# Patient Record
Sex: Female | Born: 1995 | Race: White | Hispanic: No | Marital: Married | Smoking: Never smoker
Health system: Southern US, Community
[De-identification: ages and names within clinical notes are randomized; demographics above are authoritative.]

## PROBLEM LIST (undated history)

## (undated) ENCOUNTER — Inpatient Hospital Stay (HOSPITAL_COMMUNITY): Payer: Self-pay

## (undated) DIAGNOSIS — F419 Anxiety disorder, unspecified: Secondary | ICD-10-CM

## (undated) DIAGNOSIS — H5500 Unspecified nystagmus: Secondary | ICD-10-CM

## (undated) DIAGNOSIS — E559 Vitamin D deficiency, unspecified: Secondary | ICD-10-CM

## (undated) DIAGNOSIS — J45909 Unspecified asthma, uncomplicated: Secondary | ICD-10-CM

## (undated) DIAGNOSIS — M069 Rheumatoid arthritis, unspecified: Secondary | ICD-10-CM

## (undated) DIAGNOSIS — L409 Psoriasis, unspecified: Secondary | ICD-10-CM

## (undated) DIAGNOSIS — F411 Generalized anxiety disorder: Secondary | ICD-10-CM

## (undated) HISTORY — DX: Unspecified nystagmus: H55.00

## (undated) HISTORY — DX: Vitamin D deficiency, unspecified: E55.9

## (undated) HISTORY — DX: Rheumatoid arthritis, unspecified: M06.9

## (undated) HISTORY — DX: Generalized anxiety disorder: F41.1

## (undated) HISTORY — PX: REVISION OF SCAR TISSUE RECTUS MUSCLE: SHX2351

## (undated) HISTORY — PX: WISDOM TOOTH EXTRACTION: SHX21

---

## 2017-06-07 ENCOUNTER — Encounter (HOSPITAL_COMMUNITY): Payer: Self-pay | Admitting: Emergency Medicine

## 2017-06-07 ENCOUNTER — Emergency Department (HOSPITAL_COMMUNITY)
Admission: EM | Admit: 2017-06-07 | Discharge: 2017-06-08 | Disposition: A | Discharge status: Home / Self Care | Payer: BLUE CROSS/BLUE SHIELD | Attending: Emergency Medicine | Admitting: Emergency Medicine

## 2017-06-07 ENCOUNTER — Other Ambulatory Visit: Payer: Self-pay

## 2017-06-07 DIAGNOSIS — N3001 Acute cystitis with hematuria: Secondary | ICD-10-CM | POA: Diagnosis not present

## 2017-06-07 DIAGNOSIS — R300 Dysuria: Secondary | ICD-10-CM | POA: Diagnosis present

## 2017-06-07 LAB — URINALYSIS, ROUTINE W REFLEX MICROSCOPIC
Bilirubin Urine: NEGATIVE
Glucose, UA: NEGATIVE mg/dL
Ketones, ur: NEGATIVE mg/dL
Nitrite: NEGATIVE
PH: 7 (ref 5.0–8.0)
Protein, ur: NEGATIVE mg/dL
SPECIFIC GRAVITY, URINE: 1.002 — AB (ref 1.005–1.030)

## 2017-06-07 LAB — POC URINE PREG, ED: Preg Test, Ur: NEGATIVE

## 2017-06-07 MED ORDER — PHENAZOPYRIDINE HCL 200 MG PO TABS: 200.0000 mg | ORAL_TABLET | Freq: Three times a day (TID) | ORAL | 0 refills | Status: DC

## 2017-06-07 MED ORDER — PHENAZOPYRIDINE HCL 200 MG PO TABS
200.0000 mg | ORAL_TABLET | Freq: Once | ORAL | Status: AC
  Administered 2017-06-07: 200 mg via ORAL
  Filled 2017-06-07: qty 1

## 2017-06-07 MED ORDER — NITROFURANTOIN MONOHYD MACRO 100 MG PO CAPS: 100.0000 mg | ORAL_CAPSULE | Freq: Two times a day (BID) | ORAL | 0 refills | Status: DC

## 2017-06-07 MED ORDER — NITROFURANTOIN MONOHYD MACRO 100 MG PO CAPS
100.0000 mg | ORAL_CAPSULE | Freq: Once | ORAL | Status: AC
  Administered 2017-06-07: 100 mg via ORAL
  Filled 2017-06-07: qty 1

## 2017-06-07 NOTE — Discharge Instructions (Signed)
We have sent your urine for culture. If we need to change your medication someone will call you.

## 2017-06-07 NOTE — ED Provider Notes (Signed)
[facility]-EMERGENCY DEPT Provider Note   CSN: [id number] Arrival date & time: 06/07/17  2106     History   Chief Complaint Chief Complaint  Patient presents with  . Dysuria    HPI Debra Payne is a 21 y.o. female presents to the ED with UTI symptoms that started just a few hours ago. Patient states that she has had UTI's in the past and knows that this is one. She denies vaginal d/c and has no concern for STI's.   HPI  History reviewed. No pertinent past medical history.  There are no active problems to display for this patient.   History reviewed. No pertinent surgical history.  OB History    No data available       Home Medications    Prior to Admission medications   Medication Sig Start Date End Date Taking? Authorizing Provider  nitrofurantoin, macrocrystal-monohydrate, (MACROBID) 100 MG capsule Take 1 capsule (100 mg total) by mouth 2 (two) times daily. 06/07/17   Janne NapoleonNeese, Namiah Dunnavant M, NP  phenazopyridine (PYRIDIUM) 200 MG tablet Take 1 tablet (200 mg total) by mouth 3 (three) times daily. 06/07/17   Janne NapoleonNeese, Laelani Vasko M, NP    Family History No family history on file.  Social History Social History   Tobacco Use  . Smoking status: Not on file  Substance Use Topics  . Alcohol use: Not on file  . Drug use: Not on file     Allergies   Amoxicillin   Review of Systems Review of Systems  Constitutional: Negative for fever.  HENT: Negative.   Gastrointestinal: Positive for abdominal pain. Negative for nausea and vomiting.  Genitourinary: Positive for dysuria, frequency and urgency. Negative for flank pain.  Musculoskeletal: Negative for back pain.  Skin: Negative for rash.  Neurological: Negative for headaches.  Psychiatric/Behavioral: Negative for confusion.     Physical Exam Updated Vital Signs BP (!) 132/98 (BP Location: Right Arm)   Pulse 72   Temp 98.4 F (36.9 C) (Oral)   Resp 18   LMP 06/06/2017   SpO2 100%    Physical Exam  Constitutional: She is oriented to person, place, and time. She appears well-developed and well-nourished. No distress.  HENT:  Head: Normocephalic and atraumatic.  Eyes: EOM are normal.  Neck: Neck supple.  Cardiovascular: Normal rate.  Pulmonary/Chest: Effort normal.  Abdominal: Soft. There is tenderness in the suprapubic area. There is no rebound, no guarding and no CVA tenderness.  Genitourinary:  Genitourinary Comments: Patient declined pelvic exam.  Musculoskeletal: Normal range of motion.  Neurological: She is alert and oriented to person, place, and time. No cranial nerve deficit.  Skin: Skin is warm and dry.  Psychiatric: She has a normal mood and affect.  Nursing note and vitals reviewed.    ED Treatments / Results  Labs (all labs ordered are listed, but only abnormal results are displayed) Labs Reviewed  URINALYSIS, ROUTINE W REFLEX MICROSCOPIC - Abnormal; Notable for the following components:      Result Value   Specific Gravity, Urine 1.002 (*)    Hgb urine dipstick LARGE (*)    Leukocytes, UA MODERATE (*)    Bacteria, UA RARE (*)    Squamous Epithelial / LPF 0-5 (*)    All other components within normal limits  URINE CULTURE  POC URINE PREG, ED    Radiology No results found.  Procedures Procedures (including critical care time)  Medications Ordered in ED Medications  nitrofurantoin (macrocrystal-monohydrate) (MACROBID) capsule 100 mg (  100 mg Oral Given 06/07/17 2308)  phenazopyridine (PYRIDIUM) tablet 200 mg (200 mg Oral Given 06/07/17 2308)     Initial Impression / Assessment and Plan / ED Course  I have reviewed the triage vital signs and the nursing notes. Pt has been diagnosed with a UTI. Pt is afebrile, no CVA tenderness, normotensive, and denies N/V. Pt to be dc home with antibiotics and instructions to follow up with PCP if symptoms persist.  Final Clinical Impressions(s) / ED Diagnoses   Final diagnoses:  Acute cystitis  with hematuria    ED Discharge Orders        Ordered    nitrofurantoin, macrocrystal-monohydrate, (MACROBID) 100 MG capsule  2 times daily     06/07/17 2256    phenazopyridine (PYRIDIUM) 200 MG tablet  3 times daily     06/07/17 2256       Kerrie Buffaloeese, Karanvir Balderston Colli[place], [place]NP 06/07/17 2312    Rolan BuccoBelfi, Melanie, MD 06/07/17 2334

## 2017-06-07 NOTE — ED Triage Notes (Signed)
Patient reports urinary frequency and pain with urination x a couple hours. Describes urine as cloudy. Hx UTI. Denies N/V/D.

## 2017-06-10 LAB — URINE CULTURE: Culture: >=100000 — AB

## 2017-06-11 ENCOUNTER — Telehealth: Payer: Self-pay | Admitting: *Deleted

## 2017-06-11 NOTE — Telephone Encounter (Signed)
Post ED Visit - Positive Culture Follow-up  Culture report reviewed by antimicrobial stewardship pharmacist:  []  Enzo BiNathan Batchelder, Pharm.D. []  Celedonio MiyamotoJeremy Frens, Pharm.D., BCPS AQ-ID []  Garvin FilaMike Maccia, Pharm.D., BCPS []  Georgina PillionElizabeth Martin, Pharm.D., BCPS []  RosevilleMinh Pham, 17[address] Rainbow BoulevardPharm.D., BCPS, AAHIVP []  Estella HuskMichelle Turner, Pharm.D., BCPS, AAHIVP []  Lysle Pearlachel Rumbarger, PharmD, BCPS []  Blake DivineShannon Parkey, PharmD []  Pollyann SamplesAndy Johnston, PharmD, BCPS Dimple NanasShannon Parker, PharmD  Positive urine culture Treated with Nitrofurantoin Monohyd Macro, organism sensitive to the same and no further patient follow-up is required at this time.  Virl AxeRobertson, Miriam Liles [place] 06/11/2017, 11:09 AM

## 2018-06-06 ENCOUNTER — Other Ambulatory Visit: Payer: Self-pay

## 2018-06-06 ENCOUNTER — Emergency Department (HOSPITAL_BASED_OUTPATIENT_CLINIC_OR_DEPARTMENT_OTHER)
Admission: EM | Admit: 2018-06-06 | Discharge: 2018-06-06 | Disposition: A | Discharge status: Home / Self Care | Payer: BLUE CROSS/BLUE SHIELD | Attending: Emergency Medicine | Admitting: Emergency Medicine

## 2018-06-06 ENCOUNTER — Emergency Department (HOSPITAL_BASED_OUTPATIENT_CLINIC_OR_DEPARTMENT_OTHER): Payer: BLUE CROSS/BLUE SHIELD

## 2018-06-06 ENCOUNTER — Encounter (HOSPITAL_BASED_OUTPATIENT_CLINIC_OR_DEPARTMENT_OTHER): Payer: Self-pay | Admitting: Emergency Medicine

## 2018-06-06 DIAGNOSIS — Z79899 Other long term (current) drug therapy: Secondary | ICD-10-CM | POA: Diagnosis not present

## 2018-06-06 DIAGNOSIS — K208 Other esophagitis without bleeding: Secondary | ICD-10-CM

## 2018-06-06 DIAGNOSIS — R079 Chest pain, unspecified: Secondary | ICD-10-CM | POA: Diagnosis present

## 2018-06-06 MED ORDER — ALUM & MAG HYDROXIDE-SIMETH 200-200-20 MG/5ML PO SUSP
15.0000 mL | Freq: Once | ORAL | Status: AC
  Administered 2018-06-06: 15 mL via ORAL
  Filled 2018-06-06: qty 30

## 2018-06-06 NOTE — ED Provider Notes (Signed)
[facility] EMERGENCY DEPARTMENT Provider Note   CSN: [id number] Arrival date & time: 06/06/18  1858     History   Chief Complaint Chief Complaint  Patient presents with  . Chest Pain    HPI Debra Payne is a 22 y.o. female.  22 yo F with a chief complaint of pain that starts the back of her throat and has down to her abdomen.  Described as a burning pain.  Going on for the past for 5 days.  Patient recently started on doxycycline for acne.  She denies fevers or chills denies abdominal pain has nausea but denies vomiting.  Denies chest pain.  Denies exertional symptoms.  The history is provided by the patient and the spouse.  Chest Pain   This is a new problem. The current episode started yesterday. The problem occurs constantly. The problem has not changed since onset.The pain is associated with eating. The pain is present in the substernal region. The pain is at a severity of 4/10. The patient is experiencing no pain. The pain radiates to the epigastrium. Duration of episode(s) is 5 days. Associated symptoms include nausea. Pertinent negatives include no abdominal pain, no dizziness, no fever, no headaches, no palpitations, no shortness of breath and no vomiting. She has tried nothing for the symptoms. The treatment provided no relief.    History reviewed. No pertinent past medical history.  There are no active problems to display for this patient.   History reviewed. No pertinent surgical history.   OB History   No obstetric history on file.      Home Medications    Prior to Admission medications   Medication Sig Start Date End Date Taking? Authorizing Provider  busPIRone (BUSPAR) 30 MG tablet Take 30 mg by mouth 2 (two) times daily.   Yes [provider]  doxycycline (ADOXA) 50 MG tablet Take 50 mg by mouth 2 (two) times daily.   Yes [provider]  levocetirizine (XYZAL) 5 MG tablet Take 5 mg by mouth every evening.   Yes  [provider]  methotrexate (RHEUMATREX) 10 MG tablet Take 10 mg by mouth once a week. Caution: Chemotherapy. Protect from light.   Yes [provider]  montelukast (SINGULAIR) 10 MG tablet Take 10 mg by mouth at bedtime.   Yes [provider]  norethindrone-ethinyl estradiol-iron (ESTROSTEP FE,TILIA FE,TRI-LEGEST FE) 1-20/1-30/1-35 MG-MCG tablet Take 1 tablet by mouth daily.   Yes [provider]  nitrofurantoin, macrocrystal-monohydrate, (MACROBID) 100 MG capsule Take 1 capsule (100 mg total) by mouth 2 (two) times daily. 06/07/17   Janne Napoleon, NP  phenazopyridine (PYRIDIUM) 200 MG tablet Take 1 tablet (200 mg total) by mouth 3 (three) times daily. 06/07/17   Janne Napoleon, NP    Family History History reviewed. No pertinent family history.  Social History Social History   Tobacco Use  . Smoking status: Never Smoker  . Smokeless tobacco: Never Used  Substance Use Topics  . Alcohol use: Never    Frequency: Never  . Drug use: Never     Allergies   Amoxicillin   Review of Systems Review of Systems  Constitutional: Negative for chills and fever.  HENT: Negative for congestion and rhinorrhea.   Eyes: Negative for redness and visual disturbance.  Respiratory: Negative for shortness of breath and wheezing.   Cardiovascular: Positive for chest pain. Negative for palpitations.  Gastrointestinal: Positive for nausea. Negative for abdominal pain and vomiting.  Genitourinary: Negative for dysuria and  urgency.  Musculoskeletal: Negative for arthralgias and myalgias.  Skin: Negative for pallor and wound.  Neurological: Negative for dizziness and headaches.     Physical Exam Updated Vital Signs BP 130/88   Pulse 90   Temp 98.3 F (36.8 C)   Resp 20   Ht 5\' 10"  (1.778 m)   Wt 72.6 kg   LMP 06/06/2018   SpO2 98%   BMI 22.96 kg/m   Physical Exam Vitals signs and nursing note reviewed.  Constitutional:      General: She is not in  acute distress.    Appearance: She is well-developed. She is not diaphoretic.  HENT:     Head: Normocephalic and atraumatic.  Eyes:     Pupils: Pupils are equal, round, and reactive to light.  Neck:     Musculoskeletal: Normal range of motion and neck supple.  Cardiovascular:     Rate and Rhythm: Normal rate and regular rhythm.     Heart sounds: No murmur. No friction rub. No gallop.   Pulmonary:     Effort: Pulmonary effort is normal.     Breath sounds: No wheezing or rales.  Abdominal:     General: There is no distension.     Palpations: Abdomen is soft.     Tenderness: There is no abdominal tenderness.  Musculoskeletal:        General: No tenderness.  Skin:    General: Skin is warm and dry.  Neurological:     Mental Status: She is alert and oriented to person, place, and time.  Psychiatric:        Behavior: Behavior normal.      ED Treatments / Results  Labs (all labs ordered are listed, but only abnormal results are displayed) Labs Reviewed - No data to display  EKG EKG Interpretation  Date/Time:  Sunday June 06 2018 19:27:31 EST Ventricular Rate:  77 PR Interval:  114 QRS Duration: 82 QT Interval:  394 QTC Calculation: 445 R Axis:   64 Text Interpretation:  Normal sinus rhythm Normal ECG No old tracing to compare Confirmed by Makeda Peeks ([id number]) on 06/06/2018 9:49:23 PM   Radiology Dg Chest 2 View  Result Date: 06/06/2018 CLINICAL DATA:  Burning chest pain (esophageal pain?) over the past 3-4 days unrelieved by over-the-counter treatment. Current history of asthma. EXAM: CHEST - 2 VIEW COMPARISON:  None. FINDINGS: Cardiomediastinal silhouette unremarkable. Lungs clear. Bronchovascular markings normal. Pulmonary vascularity normal. No pneumothorax. No pleural effusions. Visualized bony thorax intact. IMPRESSION: Normal examination. Electronically Signed   By: Thomas  Lawrence M.D.   On: 06/06/2018 20:05    Procedures Procedures (including critical care  time)  Medications Ordered in ED Medications  alum & mag hydroxide-simeth (MAALOX/MYLANTA) 200-200-20 MG/5ML suspension 15 mL (15 mLs Oral Given 06/06/18 2235)     Initial Impression / Assessment and Plan / ED Course  I have reviewed the triage vital signs and the nursing notes.  Pertinent labs & imaging results that were available during my care of the patient were reviewed by me and considered in my medical decision making (see chart for details).     22  yo F clinically with pill esophagitis.  Recently started on doxycycline and now with pain when she swallows.  We will have her do a trial of Zantac or Pepcid.  Given a dose of Maalox here with some improvement.  Discharge home.  Patient had a chest x-ray viewed by me without focal infiltrate or pneumothorax.  EKG without concerning finding.  11:36 PM:  I have discussed the diagnosis/risks/treatment options with the patient and family and believe the pt to be eligible for discharge home to follow-up with PCP. We also discussed returning to the ED immediately if new or worsening sx occur. We discussed the sx which are most concerning (e.g., sudden worsening pain, fever, inability to tolerate by mouth ) that necessitate immediate return. Medications administered to the patient during their visit and any new prescriptions provided to the patient are listed below.  Medications given during this visit Medications  alum & mag hydroxide-simeth (MAALOX/MYLANTA) 200-200-20 MG/5ML suspension 15 mL (15 mLs Oral Given 06/06/18 2235)      The patient appears reasonably screen and/or stabilized for discharge and I doubt any other medical condition or other [facility] requiring further screening, evaluation, or treatment in the ED at this time prior to discharge.    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

## 2018-06-06 NOTE — Discharge Instructions (Signed)
Try zantac or pepcid twice a day.  Try to avoid things that may make this worse, most commonly these are spicy foods tomato based products fatty foods chocolate and peppermint.  Alcohol and tobacco can also make this worse.  Return to the emergency department for sudden worsening pain fever or inability to eat or drink. ° °

## 2018-06-06 NOTE — ED Triage Notes (Signed)
Pt states she is having in and out cp and burning sensation going on her mid chest with some nausea or vomiting.

## 2019-03-03 ENCOUNTER — Other Ambulatory Visit: Payer: Self-pay

## 2019-03-03 ENCOUNTER — Ambulatory Visit
Admission: RE | Admit: 2019-03-03 | Discharge: 2019-03-03 | Disposition: A | Discharge status: Home / Self Care | Payer: BC Managed Care – PPO | Source: Ambulatory Visit | Attending: Obstetrics and Gynecology | Admitting: Obstetrics and Gynecology

## 2019-03-03 ENCOUNTER — Other Ambulatory Visit: Payer: Self-pay | Admitting: Obstetrics and Gynecology

## 2019-03-03 DIAGNOSIS — M545 Low back pain, unspecified: Secondary | ICD-10-CM

## 2019-03-03 DIAGNOSIS — M5416 Radiculopathy, lumbar region: Secondary | ICD-10-CM

## 2019-04-29 ENCOUNTER — Other Ambulatory Visit: Payer: Self-pay

## 2019-04-29 DIAGNOSIS — Z20822 Contact with and (suspected) exposure to covid-19: Secondary | ICD-10-CM

## 2019-05-02 LAB — NOVEL CORONAVIRUS, NAA: SARS-CoV-2, NAA: DETECTED — AB

## 2019-05-10 ENCOUNTER — Ambulatory Visit
Admission: RE | Admit: 2019-05-10 | Discharge: 2019-05-10 | Disposition: A | Discharge status: Home / Self Care | Payer: BC Managed Care – PPO | Source: Ambulatory Visit | Attending: Obstetrics and Gynecology | Admitting: Obstetrics and Gynecology

## 2019-05-10 ENCOUNTER — Other Ambulatory Visit: Payer: Self-pay | Admitting: Obstetrics and Gynecology

## 2019-05-10 ENCOUNTER — Other Ambulatory Visit: Payer: Self-pay

## 2019-05-10 DIAGNOSIS — U071 COVID-19: Secondary | ICD-10-CM

## 2019-05-10 DIAGNOSIS — R0600 Dyspnea, unspecified: Secondary | ICD-10-CM

## 2021-09-11 IMAGING — CR DG LUMBAR SPINE COMPLETE 4+V
5 series · 5 of 5 positions shown · non-contrast
Comparison: None.

CLINICAL DATA: Lumbago with bilateral radicular symptoms

EXAM:
LUMBAR SPINE - COMPLETE 4+ VIEW

[t lumbar spine ap]
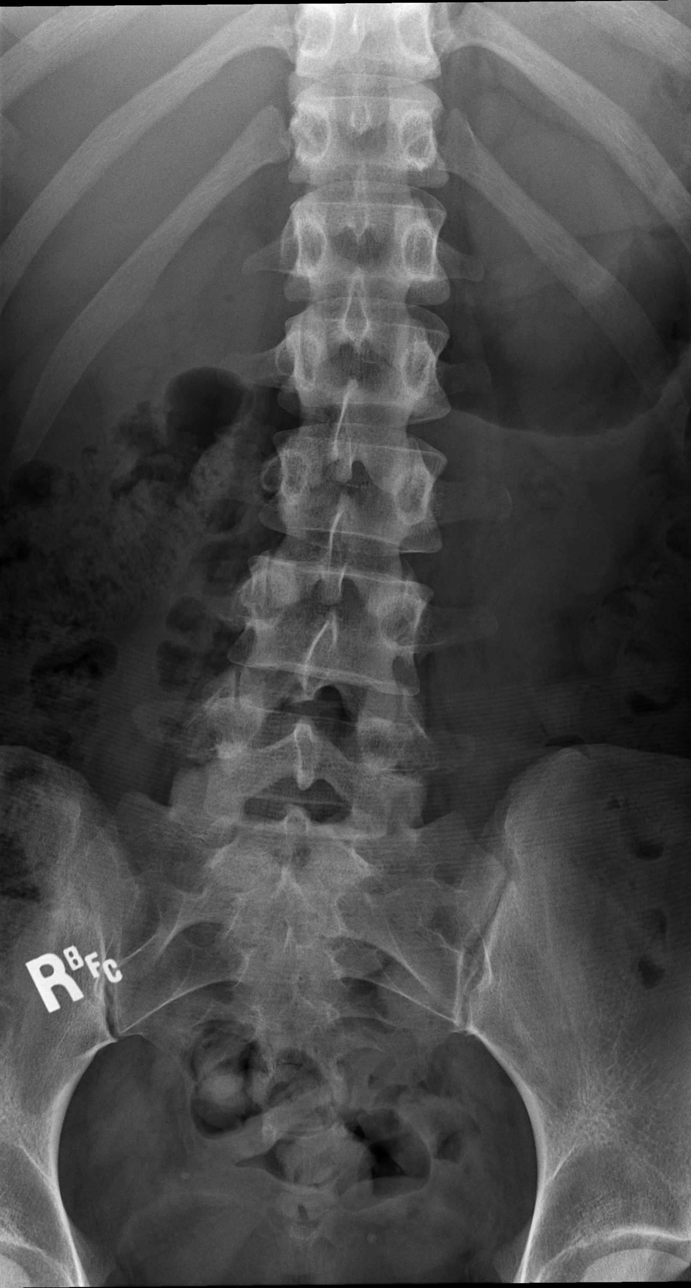

[t lumbar spine obl (1 of 2)]
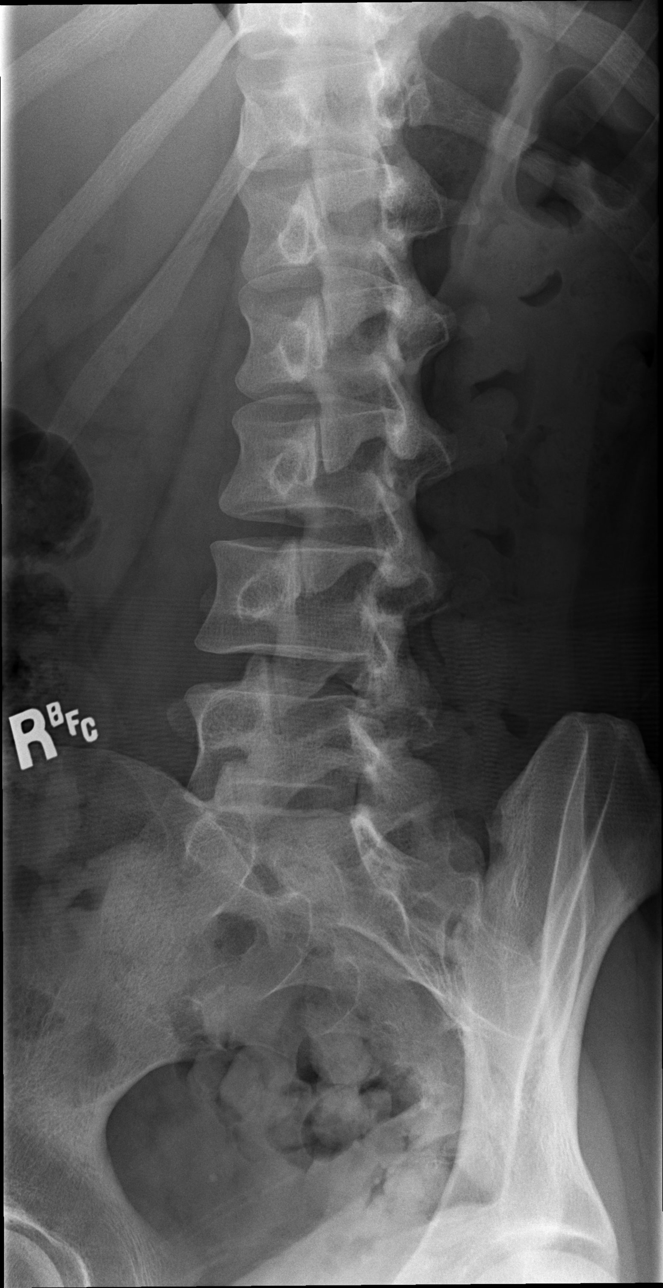

[t lumbar spine obl (2 of 2)]
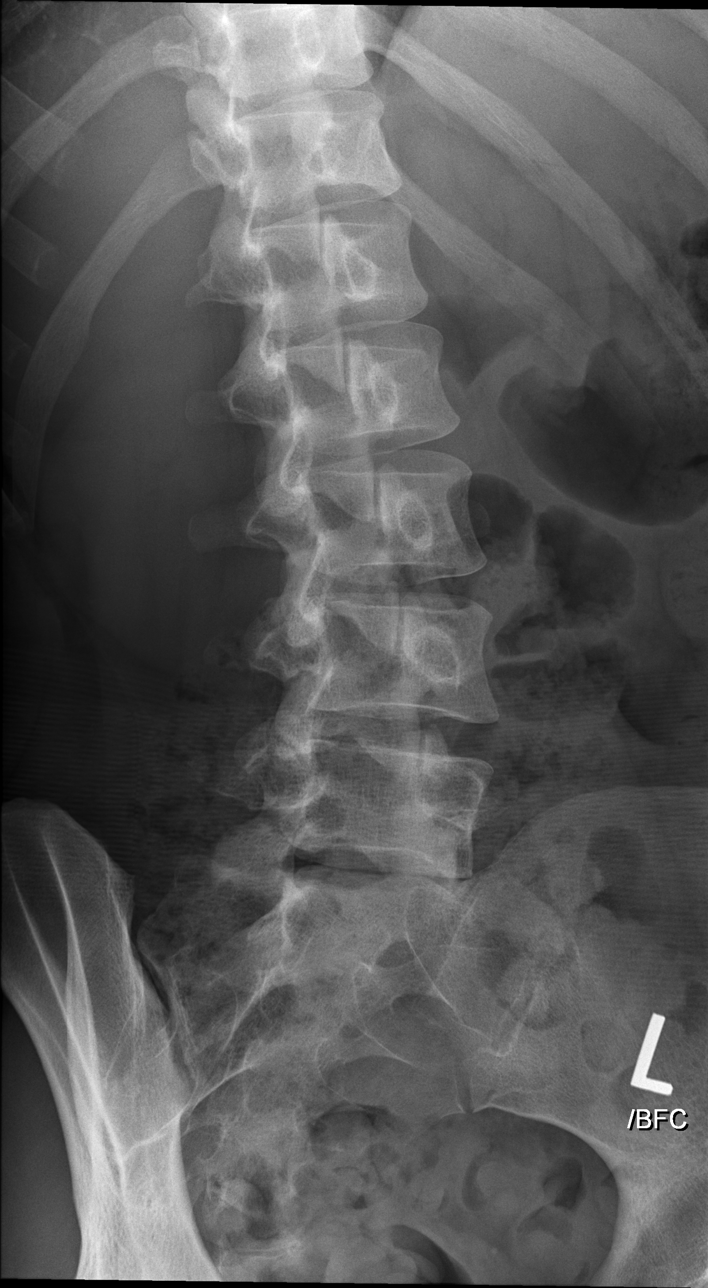

[t lumbar spine lat]
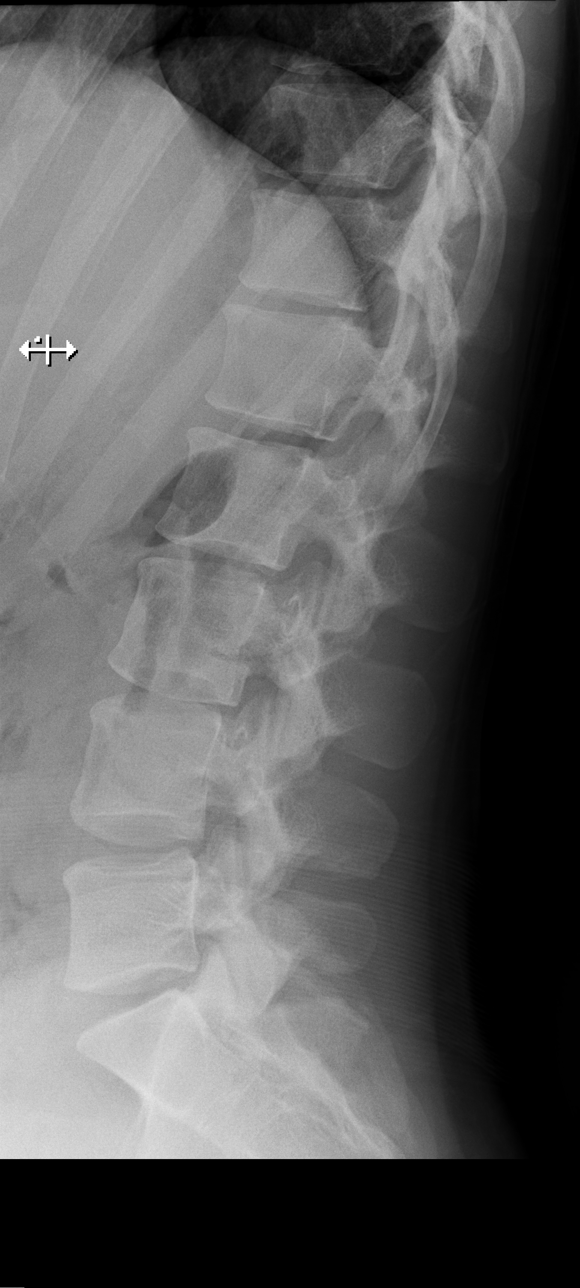

[t lumbar l-5 s-1 spot]
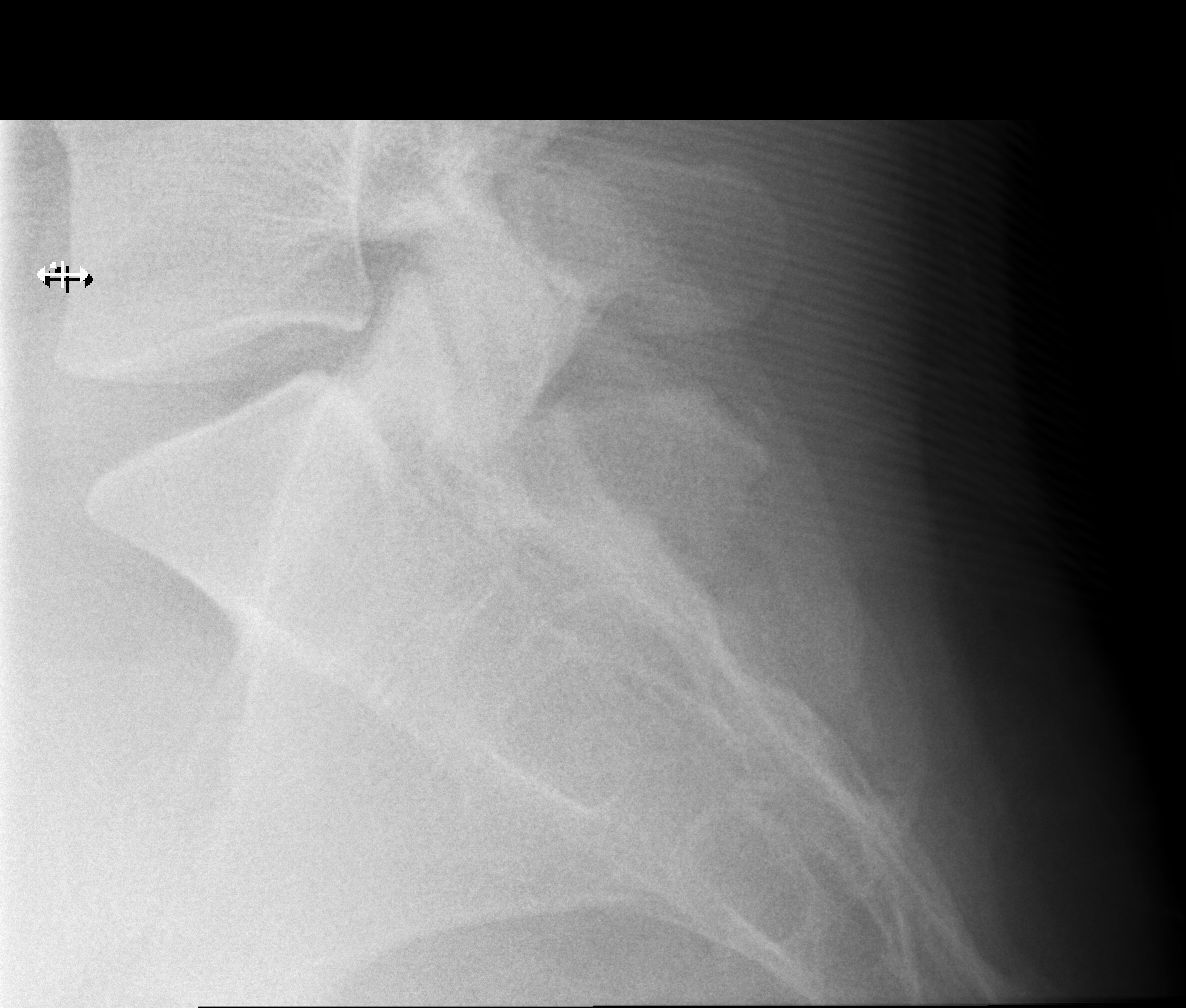

[5 of 5 positions shown; findings below may reference images not displayed]

FINDINGS: Frontal, lateral, spot lumbosacral lateral, and bilateral oblique
views were obtained. There are 5 non-rib-bearing lumbar type
vertebral bodies. There is levoscoliosis. There is no evident
fracture. There is 3 mm of retrolisthesis of L5 on S1. No other
spondylolisthesis evident. Disc spaces appear unremarkable. There is
no appreciable facet arthropathy.
IMPRESSION: Scoliosis. No evident fracture. Slight spondylolisthesis at L5-S1.
No other spondylolisthesis evident. No appreciable disc space
narrowing or facet arthropathy.

## 2021-11-18 IMAGING — DX DG CHEST 2V
2 series · 2 of 2 positions shown · non-contrast
Comparison: 06/06/2018

CLINICAL DATA: FMHM2-LE.  Dyspnea.  Chest pain.

EXAM:
CHEST - 2 VIEW

[dg chest 2 view (1 of 2)]
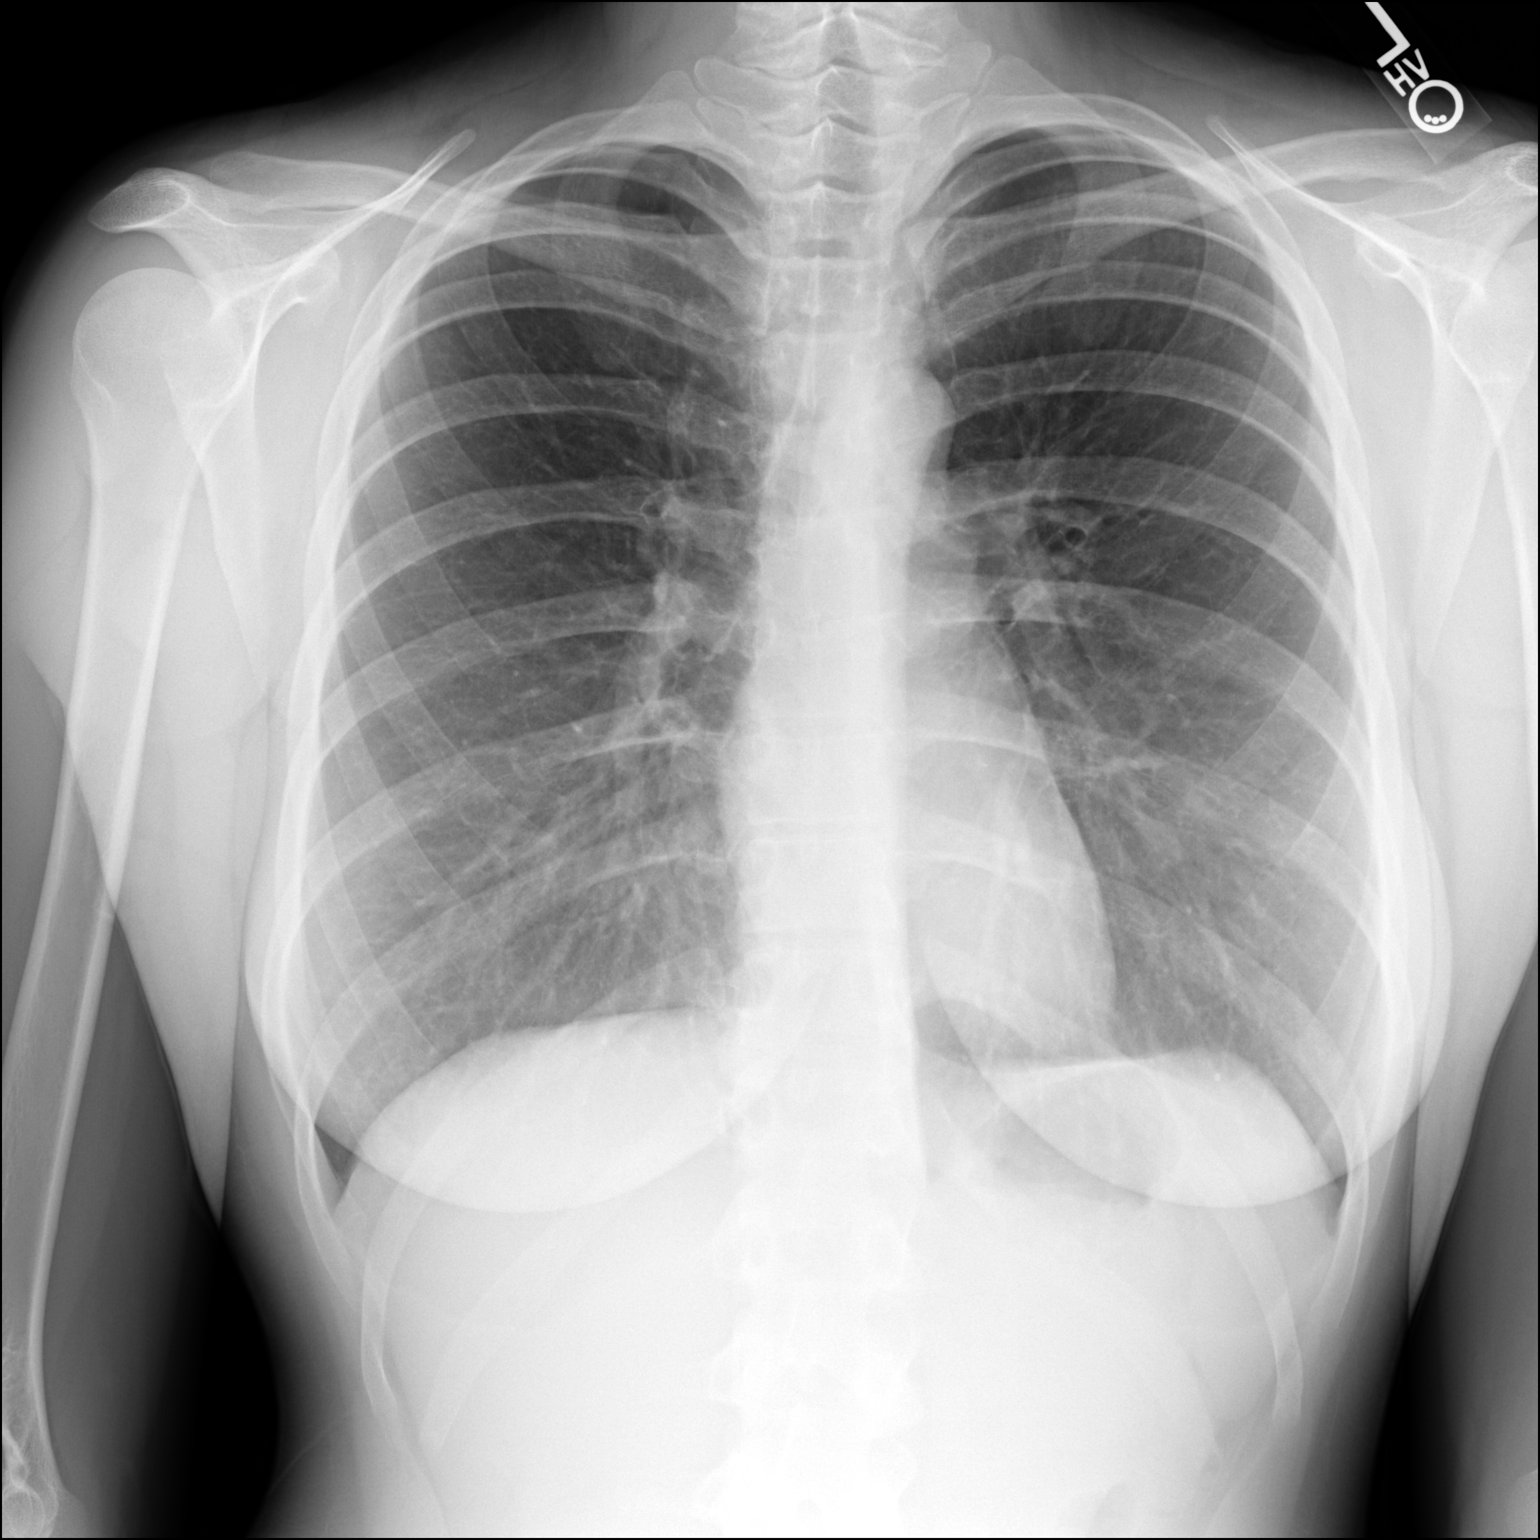

[dg chest 2 view (2 of 2)]
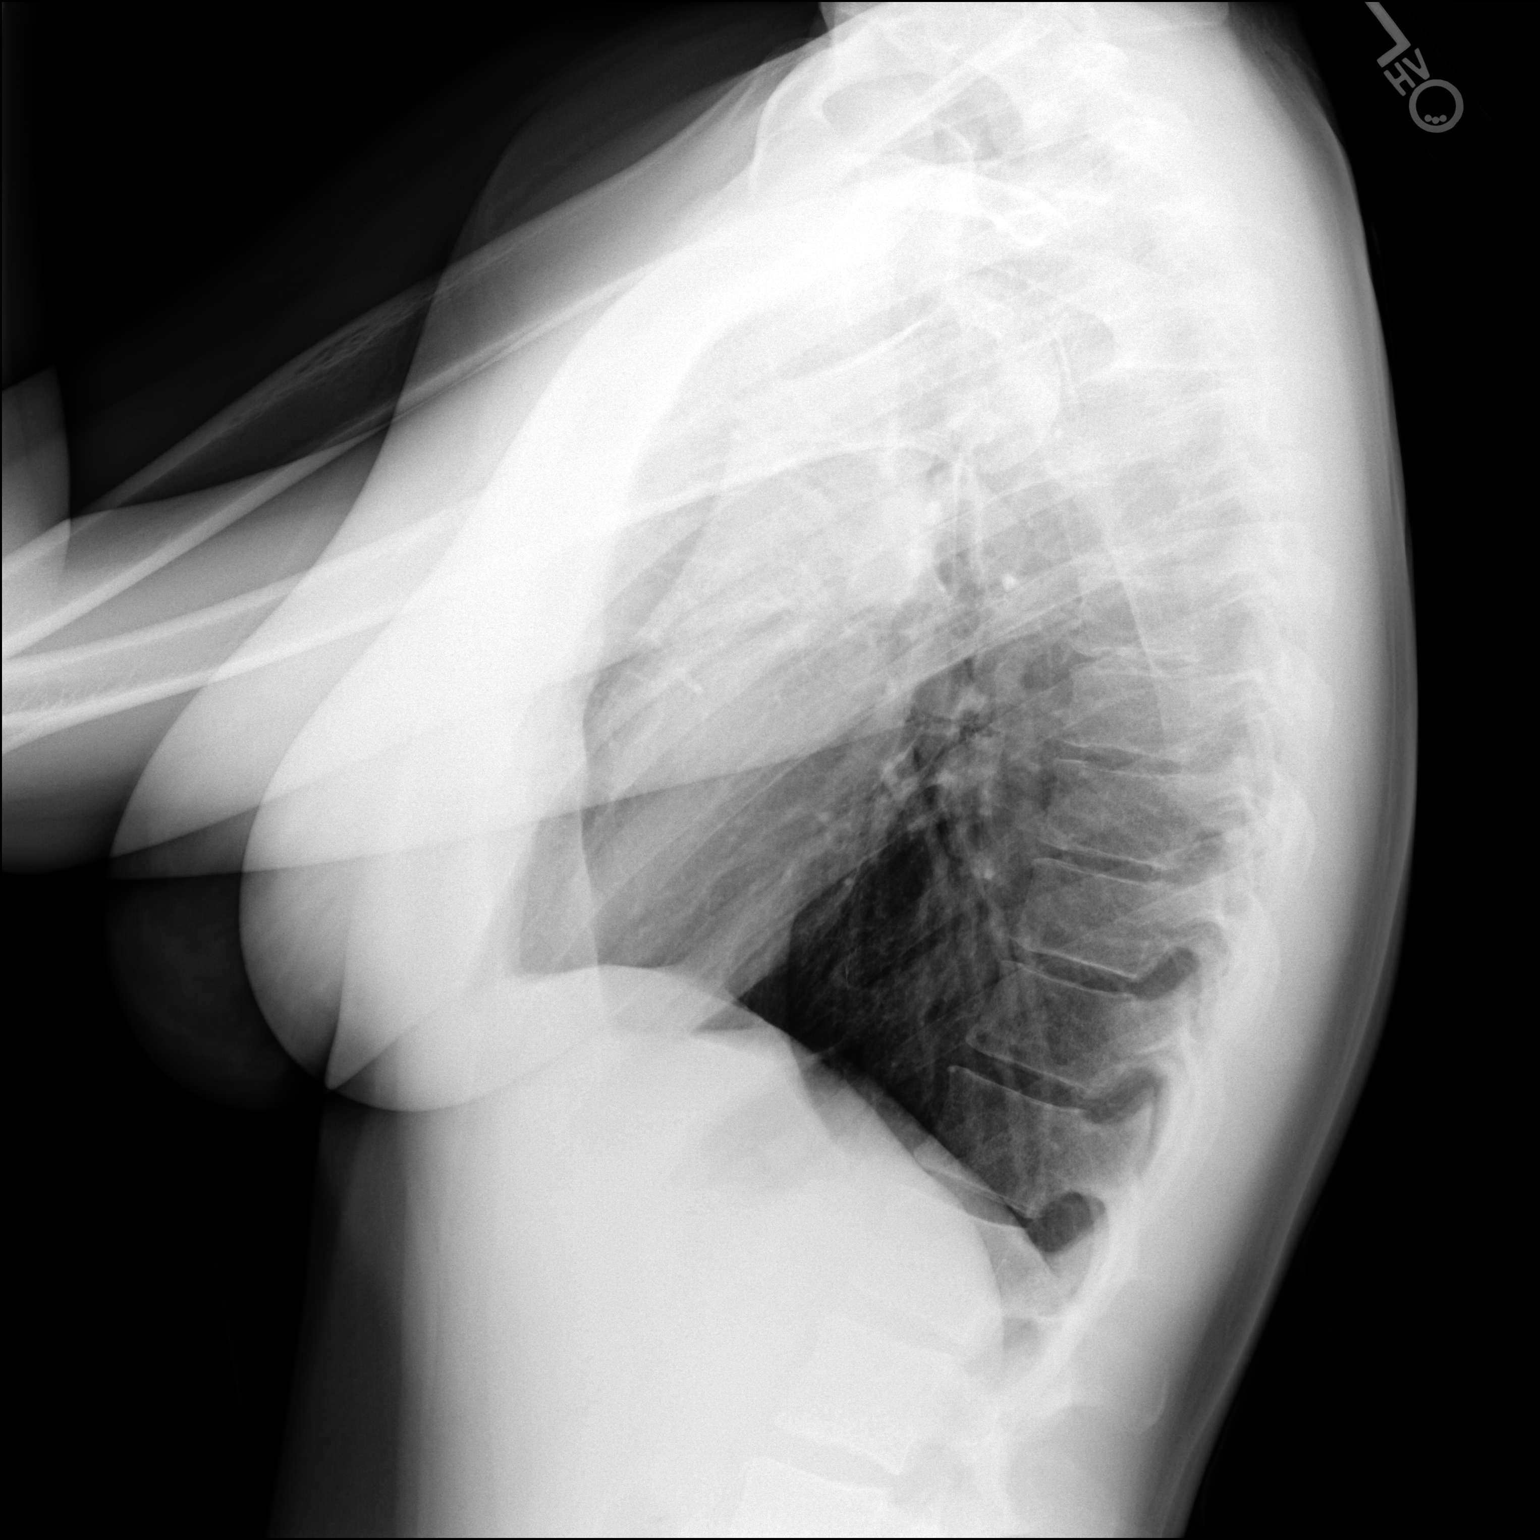

[2 of 2 positions shown; findings below may reference images not displayed]

FINDINGS: The heart size and mediastinal contours are within normal limits.
Both lungs are clear. The visualized skeletal structures are
unremarkable.
IMPRESSION: Normal exam.

## 2022-04-27 ENCOUNTER — Emergency Department (HOSPITAL_BASED_OUTPATIENT_CLINIC_OR_DEPARTMENT_OTHER): Payer: BC Managed Care – PPO | Admitting: Radiology

## 2022-04-27 ENCOUNTER — Other Ambulatory Visit: Payer: Self-pay

## 2022-04-27 ENCOUNTER — Encounter (HOSPITAL_BASED_OUTPATIENT_CLINIC_OR_DEPARTMENT_OTHER): Payer: Self-pay | Admitting: Emergency Medicine

## 2022-04-27 ENCOUNTER — Emergency Department (HOSPITAL_BASED_OUTPATIENT_CLINIC_OR_DEPARTMENT_OTHER)
Admission: EM | Admit: 2022-04-27 | Discharge: 2022-04-28 | Disposition: A | Discharge status: Home / Self Care | Payer: BC Managed Care – PPO | Attending: Emergency Medicine | Admitting: Emergency Medicine

## 2022-04-27 DIAGNOSIS — S93401A Sprain of unspecified ligament of right ankle, initial encounter: Secondary | ICD-10-CM | POA: Diagnosis not present

## 2022-04-27 DIAGNOSIS — S99911A Unspecified injury of right ankle, initial encounter: Secondary | ICD-10-CM | POA: Diagnosis present

## 2022-04-27 DIAGNOSIS — X501XXA Overexertion from prolonged static or awkward postures, initial encounter: Secondary | ICD-10-CM | POA: Insufficient documentation

## 2022-04-27 HISTORY — DX: Anxiety disorder, unspecified: F41.9

## 2022-04-27 HISTORY — DX: Psoriasis, unspecified: L40.9

## 2022-04-27 HISTORY — DX: Unspecified asthma, uncomplicated: J45.909

## 2022-04-27 NOTE — ED Provider Notes (Signed)
[facility] EMERGENCY DEPT  Provider Note  CSN: [id number] Arrival date & time: 04/27/22 1752  History Chief Complaint  Patient presents with   Ankle Pain    Debra Payne is a 26 y.o. female reports she stumbled down her front steps twisting her R ankle earlier tonight. Has been able to bear weight minimally since then, has noted swelling laterally.    Home Medications Prior to Admission medications   Medication Sig Start Date End Date Taking? Authorizing Provider  busPIRone (BUSPAR) 30 MG tablet Take 30 mg by mouth 2 (two) times daily.    [provider]  doxycycline (ADOXA) 50 MG tablet Take 50 mg by mouth 2 (two) times daily.    [provider]  levocetirizine (XYZAL) 5 MG tablet Take 5 mg by mouth every evening.    [provider]  methotrexate (RHEUMATREX) 10 MG tablet Take 10 mg by mouth once a week. Caution: Chemotherapy. Protect from light.    [provider]  montelukast (SINGULAIR) 10 MG tablet Take 10 mg by mouth at bedtime.    [provider]  nitrofurantoin, macrocrystal-monohydrate, (MACROBID) 100 MG capsule Take 1 capsule (100 mg total) by mouth 2 (two) times daily. 06/07/17   Janne Napoleon, NP  norethindrone-ethinyl estradiol-iron (ESTROSTEP FE,TILIA FE,TRI-LEGEST FE) 1-20/1-30/1-35 MG-MCG tablet Take 1 tablet by mouth daily.    [provider]  phenazopyridine (PYRIDIUM) 200 MG tablet Take 1 tablet (200 mg total) by mouth 3 (three) times daily. 06/07/17   Janne Napoleon, NP     Allergies    Amoxicillin   Review of Systems   Review of Systems Please see HPI for pertinent positives and negatives  Physical Exam BP 121/81 (BP Location: Right Arm)   Pulse 90   Temp 98.5 F (36.9 C)   Resp 18   SpO2 100%   Physical Exam Vitals and nursing note reviewed.  HENT:     Head: Normocephalic.     Nose: Nose normal.  Eyes:     Extraocular Movements: Extraocular movements intact.   Cardiovascular:     Pulses: Normal pulses.  Pulmonary:     Effort: Pulmonary effort is normal.  Musculoskeletal:        General: Swelling (R lateral ankle) and tenderness present.     Cervical back: Neck supple.  Skin:    Findings: No rash (on exposed skin).  Neurological:     Mental Status: She is alert and oriented to person, place, and time.  Psychiatric:        Mood and Affect: Mood normal.     ED Results / Procedures / Treatments   EKG None  Procedures Procedures  Medications Ordered in the ED Medications - No data to display  Initial Impression and Plan  Patient here with R ankle injury. I personally viewed the images from radiology studies and agree with radiologist interpretation: Xray is neg for fracture. Suspect a sprain. Will give ASO and crutches, advised ice, elevation and rest. She declines pain medications here, will take OTC at home. Ortho follow up if not improving in a few days.    ED Course       MDM Rules/Calculators/A&P Medical Decision Making Problems Addressed: Sprain of right ankle, unspecified ligament, initial encounter: acute illness or injury  Amount and/or Complexity of Data Reviewed Radiology: ordered and independent interpretation performed. Decision-making details documented in ED Course.  Risk OTC drugs.    Final Clinical Impression(s) / ED Diagnoses Final diagnoses:  Sprain of right ankle, unspecified ligament, initial encounter    Rx / DC Orders ED Discharge Orders     None        Pollyann Savoy, MD 04/27/22 [phone]

## 2022-04-27 NOTE — ED Triage Notes (Signed)
Missed step, twisted her right ankle. Swollen and tender.

## 2022-06-09 NOTE — L&D Delivery Note (Addendum)
Operative Delivery Note At 3:45 PM a viable female was delivered via Vaginal, Vacuum Investment banker, operational).  Presentation: vertex; Position: Occiput,, Posterior; Station: +2. Verbal consent: obtained from patient.  Risks and benefits discussed in detail.  Risks include, but are not limited to the risks of anesthesia, bleeding, infection, damage to maternal tissues, fetal cephalhematoma.  There is also the risk of inability to effect vaginal delivery of the head, or shoulder dystocia that cannot be resolved by established maneuvers, leading to the need for emergency cesarean section.  Foley catheter was removed.  Epidural anesthesia was adequate.  Vacuum applied for deep variable and late decelerations with pushing.  Vacuum applied to green zone (500 mm Hg) and vacuum pulled with maternal efforts of pushing.   Vacuum pressure released between contractions.  Vacuums applied in about 5 rounds of pushing with fetal head descent.  There was no pop offs and total time from start of vacuum applications (includes times when vacuum when applied during contractions and when it wasn't applied when not contracting) was 15 minutes.  Baby's head was delivered, no nuchal cord noted.  Shoulders were delivered without difficulty. Neonatal team available in room and baby was handed to them soon after delivery for resuscitation. Baby stayed in the room entire time  APGAR: 6, 8; weight 8 lb 5.3 oz (3780 g).   Placenta status: , .   Cord:  with the following complications: None. Cord pH: Not collected.   Anesthesia: Epidural.   Instruments: [place]wi. Episiotomy: None. Lacerations: 3rd degree; Type 3a. Perineal. Rectal; exam done and revealed intact rectal mucosa.  External anal sphincter tear repaired with 3-0 vicry using end to end anastomosis.  Perineal body and rest of repair done in usual fashion. Repeat rectal exam  revealed patent rectum and hemostatic repair. Patient had recently received ancef for GBS prophylaxis.  Suture  Repair: 3.0 vicryl. Est. Blood Loss (mL): 400. Mom to postpartum.  Baby to Couplet care / Skin to Skin.  [name], MD.  03/18/2023, 6:57 PM

## 2022-08-12 ENCOUNTER — Other Ambulatory Visit (HOSPITAL_COMMUNITY): Payer: Self-pay | Admitting: Obstetrics and Gynecology

## 2022-08-12 DIAGNOSIS — N925 Other specified irregular menstruation: Secondary | ICD-10-CM

## 2022-08-12 DIAGNOSIS — N926 Irregular menstruation, unspecified: Secondary | ICD-10-CM

## 2022-08-13 ENCOUNTER — Ambulatory Visit (HOSPITAL_COMMUNITY)
Admission: RE | Admit: 2022-08-13 | Discharge: 2022-08-13 | Disposition: A | Discharge status: Home / Self Care | Payer: BC Managed Care – PPO | Source: Ambulatory Visit | Attending: Obstetrics and Gynecology | Admitting: Obstetrics and Gynecology

## 2022-08-13 ENCOUNTER — Other Ambulatory Visit (HOSPITAL_COMMUNITY): Payer: Self-pay | Admitting: Obstetrics and Gynecology

## 2022-08-13 DIAGNOSIS — N926 Irregular menstruation, unspecified: Secondary | ICD-10-CM | POA: Insufficient documentation

## 2022-08-13 DIAGNOSIS — N925 Other specified irregular menstruation: Secondary | ICD-10-CM

## 2022-08-26 LAB — OB RESULTS CONSOLE RUBELLA ANTIBODY, IGM: Rubella: IMMUNE

## 2022-08-26 LAB — OB RESULTS CONSOLE HIV ANTIBODY (ROUTINE TESTING): HIV: NONREACTIVE

## 2022-08-26 LAB — OB RESULTS CONSOLE HEPATITIS B SURFACE ANTIGEN: Hepatitis B Surface Ag: NEGATIVE

## 2022-08-26 LAB — OB RESULTS CONSOLE GC/CHLAMYDIA
Chlamydia: NEGATIVE
Neisseria Gonorrhea: NEGATIVE

## 2022-08-26 LAB — OB RESULTS CONSOLE RPR: RPR: NONREACTIVE

## 2022-08-26 LAB — HEPATITIS C ANTIBODY: HCV Ab: NEGATIVE

## 2022-08-26 LAB — OB RESULTS CONSOLE ANTIBODY SCREEN: Antibody Screen: NEGATIVE

## 2022-10-02 ENCOUNTER — Other Ambulatory Visit: Payer: Self-pay | Admitting: Obstetrics and Gynecology

## 2022-10-02 DIAGNOSIS — Z363 Encounter for antenatal screening for malformations: Secondary | ICD-10-CM

## 2022-11-05 ENCOUNTER — Encounter: Payer: Self-pay | Admitting: *Deleted

## 2022-11-06 ENCOUNTER — Ambulatory Visit: Payer: BC Managed Care – PPO

## 2022-11-06 ENCOUNTER — Other Ambulatory Visit: Payer: BC Managed Care – PPO

## 2022-11-07 ENCOUNTER — Ambulatory Visit: Payer: BC Managed Care – PPO | Admitting: *Deleted

## 2022-11-07 ENCOUNTER — Encounter: Payer: Self-pay | Admitting: *Deleted

## 2022-11-07 ENCOUNTER — Ambulatory Visit: Payer: BC Managed Care – PPO | Attending: Obstetrics and Gynecology

## 2022-11-07 ENCOUNTER — Other Ambulatory Visit: Payer: Self-pay | Admitting: *Deleted

## 2022-11-07 VITALS — BP 105/58 | HR 78

## 2022-11-07 DIAGNOSIS — Z3689 Encounter for other specified antenatal screening: Secondary | ICD-10-CM | POA: Insufficient documentation

## 2022-11-07 DIAGNOSIS — Z363 Encounter for antenatal screening for malformations: Secondary | ICD-10-CM | POA: Insufficient documentation

## 2022-11-07 DIAGNOSIS — D259 Leiomyoma of uterus, unspecified: Secondary | ICD-10-CM

## 2022-12-15 DIAGNOSIS — D259 Leiomyoma of uterus, unspecified: Secondary | ICD-10-CM | POA: Insufficient documentation

## 2022-12-19 ENCOUNTER — Ambulatory Visit: Payer: BC Managed Care – PPO | Attending: Obstetrics

## 2022-12-19 ENCOUNTER — Ambulatory Visit: Payer: BC Managed Care – PPO | Admitting: *Deleted

## 2022-12-19 ENCOUNTER — Other Ambulatory Visit: Payer: Self-pay | Admitting: *Deleted

## 2022-12-19 VITALS — BP 118/60 | HR 84

## 2022-12-19 DIAGNOSIS — Z141 Cystic fibrosis carrier: Secondary | ICD-10-CM | POA: Diagnosis not present

## 2022-12-19 DIAGNOSIS — D259 Leiomyoma of uterus, unspecified: Secondary | ICD-10-CM

## 2022-12-19 DIAGNOSIS — O3410 Maternal care for benign tumor of corpus uteri, unspecified trimester: Secondary | ICD-10-CM | POA: Insufficient documentation

## 2022-12-19 DIAGNOSIS — O285 Abnormal chromosomal and genetic finding on antenatal screening of mother: Secondary | ICD-10-CM

## 2022-12-19 DIAGNOSIS — Z3A28 28 weeks gestation of pregnancy: Secondary | ICD-10-CM

## 2022-12-19 DIAGNOSIS — O3413 Maternal care for benign tumor of corpus uteri, third trimester: Secondary | ICD-10-CM

## 2022-12-19 DIAGNOSIS — Z3689 Encounter for other specified antenatal screening: Secondary | ICD-10-CM

## 2023-01-30 ENCOUNTER — Ambulatory Visit: Payer: BC Managed Care – PPO | Attending: Obstetrics and Gynecology

## 2023-01-30 DIAGNOSIS — O3413 Maternal care for benign tumor of corpus uteri, third trimester: Secondary | ICD-10-CM | POA: Diagnosis not present

## 2023-01-30 DIAGNOSIS — Z3A34 34 weeks gestation of pregnancy: Secondary | ICD-10-CM

## 2023-01-30 DIAGNOSIS — O285 Abnormal chromosomal and genetic finding on antenatal screening of mother: Secondary | ICD-10-CM

## 2023-01-30 DIAGNOSIS — Z141 Cystic fibrosis carrier: Secondary | ICD-10-CM | POA: Diagnosis not present

## 2023-01-30 DIAGNOSIS — D259 Leiomyoma of uterus, unspecified: Secondary | ICD-10-CM | POA: Diagnosis not present

## 2023-01-30 DIAGNOSIS — Z3689 Encounter for other specified antenatal screening: Secondary | ICD-10-CM | POA: Diagnosis present

## 2023-03-12 ENCOUNTER — Encounter (HOSPITAL_COMMUNITY): Payer: Self-pay | Admitting: *Deleted

## 2023-03-12 ENCOUNTER — Telehealth (HOSPITAL_COMMUNITY): Payer: Self-pay | Admitting: *Deleted

## 2023-03-12 LAB — OB RESULTS CONSOLE GBS: GBS: POSITIVE

## 2023-03-12 NOTE — Telephone Encounter (Signed)
Preadmission screen  

## 2023-03-17 ENCOUNTER — Inpatient Hospital Stay (HOSPITAL_COMMUNITY)
Admission: AD | Admit: 2023-03-17 | Discharge: 2023-03-20 | DRG: 768 | Disposition: A | Discharge status: Home / Self Care | Payer: BC Managed Care – PPO | Attending: Obstetrics & Gynecology | Admitting: Obstetrics & Gynecology

## 2023-03-17 DIAGNOSIS — J45909 Unspecified asthma, uncomplicated: Secondary | ICD-10-CM | POA: Diagnosis present

## 2023-03-17 DIAGNOSIS — O99824 Streptococcus B carrier state complicating childbirth: Secondary | ICD-10-CM | POA: Diagnosis present

## 2023-03-17 DIAGNOSIS — O4292 Full-term premature rupture of membranes, unspecified as to length of time between rupture and onset of labor: Principal | ICD-10-CM | POA: Diagnosis present

## 2023-03-17 DIAGNOSIS — O4290 Premature rupture of membranes, unspecified as to length of time between rupture and onset of labor, unspecified weeks of gestation: Secondary | ICD-10-CM | POA: Diagnosis present

## 2023-03-17 DIAGNOSIS — O9952 Diseases of the respiratory system complicating childbirth: Secondary | ICD-10-CM | POA: Diagnosis present

## 2023-03-17 DIAGNOSIS — Z880 Allergy status to penicillin: Secondary | ICD-10-CM

## 2023-03-17 DIAGNOSIS — O368130 Decreased fetal movements, third trimester, not applicable or unspecified: Secondary | ICD-10-CM | POA: Diagnosis present

## 2023-03-17 DIAGNOSIS — Z3689 Encounter for other specified antenatal screening: Secondary | ICD-10-CM

## 2023-03-17 DIAGNOSIS — F411 Generalized anxiety disorder: Secondary | ICD-10-CM | POA: Diagnosis present

## 2023-03-17 DIAGNOSIS — O480 Post-term pregnancy: Secondary | ICD-10-CM | POA: Diagnosis present

## 2023-03-17 DIAGNOSIS — Z3A40 40 weeks gestation of pregnancy: Secondary | ICD-10-CM

## 2023-03-17 DIAGNOSIS — O99344 Other mental disorders complicating childbirth: Secondary | ICD-10-CM | POA: Diagnosis present

## 2023-03-17 DIAGNOSIS — Z91040 Latex allergy status: Secondary | ICD-10-CM

## 2023-03-18 ENCOUNTER — Encounter (HOSPITAL_COMMUNITY): Payer: Self-pay | Admitting: Obstetrics and Gynecology

## 2023-03-18 ENCOUNTER — Inpatient Hospital Stay (HOSPITAL_COMMUNITY): Payer: BC Managed Care – PPO | Admitting: Anesthesiology

## 2023-03-18 DIAGNOSIS — Z880 Allergy status to penicillin: Secondary | ICD-10-CM | POA: Diagnosis not present

## 2023-03-18 DIAGNOSIS — O4292 Full-term premature rupture of membranes, unspecified as to length of time between rupture and onset of labor: Secondary | ICD-10-CM

## 2023-03-18 DIAGNOSIS — J45909 Unspecified asthma, uncomplicated: Secondary | ICD-10-CM | POA: Diagnosis present

## 2023-03-18 DIAGNOSIS — Z3A40 40 weeks gestation of pregnancy: Secondary | ICD-10-CM | POA: Diagnosis not present

## 2023-03-18 DIAGNOSIS — O99824 Streptococcus B carrier state complicating childbirth: Secondary | ICD-10-CM | POA: Diagnosis present

## 2023-03-18 DIAGNOSIS — O480 Post-term pregnancy: Secondary | ICD-10-CM | POA: Diagnosis present

## 2023-03-18 DIAGNOSIS — F411 Generalized anxiety disorder: Secondary | ICD-10-CM | POA: Diagnosis present

## 2023-03-18 DIAGNOSIS — Z3689 Encounter for other specified antenatal screening: Secondary | ICD-10-CM | POA: Diagnosis not present

## 2023-03-18 DIAGNOSIS — O99344 Other mental disorders complicating childbirth: Secondary | ICD-10-CM | POA: Diagnosis present

## 2023-03-18 DIAGNOSIS — O368130 Decreased fetal movements, third trimester, not applicable or unspecified: Secondary | ICD-10-CM

## 2023-03-18 DIAGNOSIS — O4290 Premature rupture of membranes, unspecified as to length of time between rupture and onset of labor, unspecified weeks of gestation: Secondary | ICD-10-CM | POA: Diagnosis present

## 2023-03-18 DIAGNOSIS — O26893 Other specified pregnancy related conditions, third trimester: Secondary | ICD-10-CM | POA: Diagnosis present

## 2023-03-18 DIAGNOSIS — O9952 Diseases of the respiratory system complicating childbirth: Secondary | ICD-10-CM | POA: Diagnosis present

## 2023-03-18 DIAGNOSIS — Z91040 Latex allergy status: Secondary | ICD-10-CM | POA: Diagnosis not present

## 2023-03-18 LAB — TYPE AND SCREEN
ABO/RH(D): A POS
Antibody Screen: NEGATIVE

## 2023-03-18 LAB — CBC
HCT: 36.5 % (ref 36.0–46.0)
Hemoglobin: 12.2 g/dL (ref 12.0–15.0)
MCH: 31.4 pg (ref 26.0–34.0)
MCHC: 33.4 g/dL (ref 30.0–36.0)
MCV: 93.8 fL (ref 80.0–100.0)
Platelets: 136 10*3/uL — ABNORMAL LOW (ref 150–400)
RBC: 3.89 MIL/uL (ref 3.87–5.11)
RDW: 12.8 % (ref 11.5–15.5)
WBC: 14.5 10*3/uL — ABNORMAL HIGH (ref 4.0–10.5)
nRBC: 0 % (ref 0.0–0.2)

## 2023-03-18 LAB — POCT FERN TEST: POCT Fern Test: POSITIVE

## 2023-03-18 LAB — RPR: RPR Ser Ql: NONREACTIVE

## 2023-03-18 MED ORDER — SIMETHICONE 80 MG PO CHEW: 80.0000 mg | CHEWABLE_TABLET | ORAL | Status: DC | PRN

## 2023-03-18 MED ORDER — LACTATED RINGERS IV SOLN: INTRAVENOUS | Status: DC

## 2023-03-18 MED ORDER — PRENATAL MULTIVITAMIN CH
1.0000 | ORAL_TABLET | Freq: Every day | ORAL | Status: DC
  Administered 2023-03-19 – 2023-03-20 (×2): 1 via ORAL
  Filled 2023-03-18 (×3): qty 1

## 2023-03-18 MED ORDER — CEFAZOLIN SODIUM-DEXTROSE 1-4 GM/50ML-% IV SOLN
1.0000 g | Freq: Three times a day (TID) | INTRAVENOUS | Status: DC
  Administered 2023-03-18: 1 g via INTRAVENOUS
  Filled 2023-03-18 (×3): qty 50

## 2023-03-18 MED ORDER — TERBUTALINE SULFATE 1 MG/ML IJ SOLN: 0.2500 mg | Freq: Once | INTRAMUSCULAR | Status: DC | PRN

## 2023-03-18 MED ORDER — BUSPIRONE HCL 5 MG PO TABS
20.0000 mg | ORAL_TABLET | Freq: Every day | ORAL | Status: DC
  Administered 2023-03-19 – 2023-03-20 (×2): 20 mg via ORAL
  Filled 2023-03-18 (×3): qty 4

## 2023-03-18 MED ORDER — OXYCODONE HCL 5 MG PO TABS: 10.0000 mg | ORAL_TABLET | ORAL | Status: DC | PRN

## 2023-03-18 MED ORDER — FLEET ENEMA RE ENEM: 1.0000 | ENEMA | RECTAL | Status: DC | PRN

## 2023-03-18 MED ORDER — CEFAZOLIN SODIUM-DEXTROSE 2-4 GM/100ML-% IV SOLN
2.0000 g | Freq: Once | INTRAVENOUS | Status: AC
  Administered 2023-03-18: 2 g via INTRAVENOUS
  Filled 2023-03-18: qty 100

## 2023-03-18 MED ORDER — OXYCODONE HCL 5 MG PO TABS: 5.0000 mg | ORAL_TABLET | ORAL | Status: DC | PRN

## 2023-03-18 MED ORDER — SODIUM CHLORIDE 0.9% FLUSH
3.0000 mL | Freq: Two times a day (BID) | INTRAVENOUS | Status: DC
  Administered 2023-03-19: 3 mL via INTRAVENOUS

## 2023-03-18 MED ORDER — OXYTOCIN-SODIUM CHLORIDE 30-0.9 UT/500ML-% IV SOLN: 2.5000 [IU]/h | INTRAVENOUS | Status: DC | PRN

## 2023-03-18 MED ORDER — EPHEDRINE 5 MG/ML INJ: 10.0000 mg | INTRAVENOUS | Status: DC | PRN

## 2023-03-18 MED ORDER — ONDANSETRON HCL 4 MG PO TABS: 4.0000 mg | ORAL_TABLET | ORAL | Status: DC | PRN

## 2023-03-18 MED ORDER — OXYTOCIN-SODIUM CHLORIDE 30-0.9 UT/500ML-% IV SOLN
2.5000 [IU]/h | INTRAVENOUS | Status: DC
  Administered 2023-03-18: 2.5 [IU]/h via INTRAVENOUS
  Filled 2023-03-18: qty 500

## 2023-03-18 MED ORDER — OXYTOCIN-SODIUM CHLORIDE 30-0.9 UT/500ML-% IV SOLN
1.0000 m[IU]/min | INTRAVENOUS | Status: DC
  Administered 2023-03-18: 2 m[IU]/min via INTRAVENOUS

## 2023-03-18 MED ORDER — LACTATED RINGERS IV SOLN: 500.0000 mL | Freq: Once | INTRAVENOUS | Status: DC

## 2023-03-18 MED ORDER — BENZOCAINE-MENTHOL 20-0.5 % EX AERO
1.0000 | INHALATION_SPRAY | CUTANEOUS | Status: DC | PRN
  Administered 2023-03-19: 1 via TOPICAL
  Filled 2023-03-18: qty 56

## 2023-03-18 MED ORDER — BUSPIRONE HCL 5 MG PO TABS
30.0000 mg | ORAL_TABLET | Freq: Two times a day (BID) | ORAL | Status: DC
  Filled 2023-03-18 (×2): qty 2
  Filled 2023-03-18: qty 6

## 2023-03-18 MED ORDER — ONDANSETRON HCL 4 MG/2ML IJ SOLN
4.0000 mg | Freq: Four times a day (QID) | INTRAMUSCULAR | Status: DC | PRN
  Administered 2023-03-18: 4 mg via INTRAVENOUS
  Filled 2023-03-18: qty 2

## 2023-03-18 MED ORDER — OXYCODONE-ACETAMINOPHEN 5-325 MG PO TABS: 2.0000 | ORAL_TABLET | ORAL | Status: DC | PRN

## 2023-03-18 MED ORDER — DIPHENHYDRAMINE HCL 25 MG PO CAPS: 25.0000 mg | ORAL_CAPSULE | Freq: Four times a day (QID) | ORAL | Status: DC | PRN

## 2023-03-18 MED ORDER — IBUPROFEN 600 MG PO TABS
600.0000 mg | ORAL_TABLET | Freq: Four times a day (QID) | ORAL | Status: DC
  Administered 2023-03-18 – 2023-03-20 (×5): 600 mg via ORAL
  Filled 2023-03-18 (×9): qty 1

## 2023-03-18 MED ORDER — LORATADINE 10 MG PO TABS
10.0000 mg | ORAL_TABLET | Freq: Every day | ORAL | Status: DC
  Filled 2023-03-18 (×2): qty 1

## 2023-03-18 MED ORDER — DIPHENHYDRAMINE HCL 50 MG/ML IJ SOLN: 12.5000 mg | INTRAMUSCULAR | Status: DC | PRN

## 2023-03-18 MED ORDER — ACETAMINOPHEN 325 MG PO TABS
650.0000 mg | ORAL_TABLET | ORAL | Status: DC | PRN
  Filled 2023-03-18: qty 2

## 2023-03-18 MED ORDER — FENTANYL CITRATE (PF) 100 MCG/2ML IJ SOLN
50.0000 ug | INTRAMUSCULAR | Status: DC | PRN
  Administered 2023-03-18: 50 ug via INTRAVENOUS
  Filled 2023-03-18: qty 2

## 2023-03-18 MED ORDER — LACTATED RINGERS IV SOLN: 500.0000 mL | INTRAVENOUS | Status: DC | PRN

## 2023-03-18 MED ORDER — WITCH HAZEL-GLYCERIN EX PADS
1.0000 | MEDICATED_PAD | CUTANEOUS | Status: DC | PRN
  Administered 2023-03-19: 1 via TOPICAL

## 2023-03-18 MED ORDER — OXYCODONE-ACETAMINOPHEN 5-325 MG PO TABS: 1.0000 | ORAL_TABLET | ORAL | Status: DC | PRN

## 2023-03-18 MED ORDER — SOD CITRATE-CITRIC ACID 500-334 MG/5ML PO SOLN: 30.0000 mL | ORAL | Status: DC | PRN

## 2023-03-18 MED ORDER — ACETAMINOPHEN 325 MG PO TABS: 650.0000 mg | ORAL_TABLET | ORAL | Status: DC | PRN

## 2023-03-18 MED ORDER — DIBUCAINE (PERIANAL) 1 % EX OINT: 1.0000 | TOPICAL_OINTMENT | CUTANEOUS | Status: DC | PRN

## 2023-03-18 MED ORDER — LEVOCETIRIZINE DIHYDROCHLORIDE 5 MG PO TABS: 5.0000 mg | ORAL_TABLET | Freq: Every evening | ORAL | Status: DC

## 2023-03-18 MED ORDER — LIDOCAINE HCL (PF) 1 % IJ SOLN: 30.0000 mL | INTRAMUSCULAR | Status: DC | PRN

## 2023-03-18 MED ORDER — SENNOSIDES-DOCUSATE SODIUM 8.6-50 MG PO TABS
2.0000 | ORAL_TABLET | Freq: Every day | ORAL | Status: DC
  Administered 2023-03-18 – 2023-03-20 (×3): 2 via ORAL
  Filled 2023-03-18 (×4): qty 2

## 2023-03-18 MED ORDER — LIDOCAINE HCL (PF) 1 % IJ SOLN
INTRAMUSCULAR | Status: DC | PRN
  Administered 2023-03-18: 8 mL via EPIDURAL

## 2023-03-18 MED ORDER — FENTANYL-BUPIVACAINE-NACL 0.5-0.125-0.9 MG/250ML-% EP SOLN
12.0000 mL/h | EPIDURAL | Status: DC | PRN
  Administered 2023-03-18: 12 mL/h via EPIDURAL
  Filled 2023-03-18: qty 250

## 2023-03-18 MED ORDER — ZOLPIDEM TARTRATE 5 MG PO TABS: 5.0000 mg | ORAL_TABLET | Freq: Every evening | ORAL | Status: DC | PRN

## 2023-03-18 MED ORDER — COCONUT OIL OIL
1.0000 | TOPICAL_OIL | Status: DC | PRN
  Administered 2023-03-19: 1 via TOPICAL

## 2023-03-18 MED ORDER — ONDANSETRON HCL 4 MG/2ML IJ SOLN: 4.0000 mg | INTRAMUSCULAR | Status: DC | PRN

## 2023-03-18 MED ORDER — ESCITALOPRAM OXALATE 10 MG PO TABS
10.0000 mg | ORAL_TABLET | Freq: Every day | ORAL | Status: DC
  Administered 2023-03-18 – 2023-03-20 (×3): 10 mg via ORAL
  Filled 2023-03-18 (×5): qty 1

## 2023-03-18 MED ORDER — FENTANYL-BUPIVACAINE-NACL 0.5-0.125-0.9 MG/250ML-% EP SOLN: 12.0000 mL/h | EPIDURAL | Status: DC | PRN

## 2023-03-18 MED ORDER — BUSPIRONE HCL 5 MG PO TABS
10.0000 mg | ORAL_TABLET | Freq: Every day | ORAL | Status: DC
  Administered 2023-03-18 – 2023-03-19 (×2): 10 mg via ORAL
  Filled 2023-03-18 (×2): qty 2

## 2023-03-18 MED ORDER — MONTELUKAST SODIUM 10 MG PO TABS
10.0000 mg | ORAL_TABLET | Freq: Every day | ORAL | Status: DC
  Administered 2023-03-18 – 2023-03-19 (×2): 10 mg via ORAL
  Filled 2023-03-18 (×3): qty 1

## 2023-03-18 MED ORDER — PHENYLEPHRINE 80 MCG/ML (10ML) SYRINGE FOR IV PUSH (FOR BLOOD PRESSURE SUPPORT): 80.0000 ug | PREFILLED_SYRINGE | INTRAVENOUS | Status: DC | PRN

## 2023-03-18 MED ORDER — SODIUM CHLORIDE 0.9 % IV SOLN: 250.0000 mL | INTRAVENOUS | Status: DC | PRN

## 2023-03-18 MED ORDER — OXYTOCIN BOLUS FROM INFUSION
333.0000 mL | Freq: Once | INTRAVENOUS | Status: AC
  Administered 2023-03-18: 333 mL via INTRAVENOUS

## 2023-03-18 MED ORDER — SODIUM CHLORIDE 0.9% FLUSH: 3.0000 mL | INTRAVENOUS | Status: DC | PRN

## 2023-03-18 MED ORDER — BUSPIRONE HCL 5 MG PO TABS: 10.0000 mg | ORAL_TABLET | Freq: Every day | ORAL | Status: DC

## 2023-03-18 NOTE — MAU Provider Note (Signed)
Chief Complaint:  Contractions and Rupture of Membranes   Seen by provider at 0105   HPI: Debra Payne is a 27 y.o. G1P0 at 17w6dwho presents to maternity admissions reporting contractions, fluid leaking and decreased fetal movement. She denies vaginal bleeding, urinary symptoms, n/v, or fever/chills.    Vaginal Discharge The patient's primary symptoms include pelvic pain and vaginal discharge. The patient's pertinent negatives include no vaginal bleeding. The current episode started today. The problem has been unchanged. The pain is moderate. Associated symptoms include abdominal pain. Pertinent negatives include no chills or fever. The vaginal discharge was watery. There has been no bleeding. She has not been passing clots. She has not been passing tissue. Nothing aggravates the symptoms. She has tried nothing for the symptoms.    Past Medical History: Past Medical History:  Diagnosis Date   Anxiety    and depression   Asthma    GAD (generalized anxiety disorder)    Nystagmus    Psoriasis    RA (rheumatoid arthritis) (HCC)    Vitamin D deficiency     Past obstetric history: OB History  Gravida Para Term Preterm AB Living  1            SAB IAB Ectopic Multiple Live Births               # Outcome Date GA Lbr Len/2nd Weight Sex Type Anes PTL Lv  1 Current             Past Surgical History: Past Surgical History:  Procedure Laterality Date   WISDOM TOOTH EXTRACTION      Family History: Family History  Adopted: Yes  Family history unknown: Yes    Social History: Social History   Tobacco Use   Smoking status: Never   Smokeless tobacco: Never  Vaping Use   Vaping status: Never Used  Substance Use Topics   Alcohol use: Never   Drug use: Never    Allergies:  Allergies  Allergen Reactions   Amoxicillin Hives   Latex Itching and Rash    Meds:  Medications Prior to Admission  Medication Sig Dispense Refill Last Dose   Adalimumab (HUMIRA, 2  SYRINGE,) 40 MG/0.4ML PSKT Inject into the skin.   Past Week   busPIRone (BUSPAR) 30 MG tablet Take 30 mg by mouth 2 (two) times daily.   03/17/2023   escitalopram (LEXAPRO) 10 MG tablet Take 10 mg by mouth daily.   03/17/2023   levocetirizine (XYZAL) 5 MG tablet Take 5 mg by mouth every evening.   03/17/2023   montelukast (SINGULAIR) 10 MG tablet Take 10 mg by mouth at bedtime.   03/17/2023   Prenatal Vit-Fe Fumarate-FA (PRENATAL MULTIVITAMIN) TABS tablet Take 1 tablet by mouth daily at 12 noon.   Past Month   doxycycline (ADOXA) 50 MG tablet Take 50 mg by mouth 2 (two) times daily.      methotrexate (RHEUMATREX) 10 MG tablet Take 10 mg by mouth once a week. Caution: Chemotherapy. Protect from light.      nitrofurantoin, macrocrystal-monohydrate, (MACROBID) 100 MG capsule Take 1 capsule (100 mg total) by mouth 2 (two) times daily. (Patient not taking: Reported on 11/07/2022) 14 capsule 0    norethindrone-ethinyl estradiol-iron (ESTROSTEP FE,TILIA FE,TRI-LEGEST FE) 1-20/1-30/1-35 MG-MCG tablet Take 1 tablet by mouth daily.      phenazopyridine (PYRIDIUM) 200 MG tablet Take 1 tablet (200 mg total) by mouth 3 (three) times daily. (Patient not taking: Reported on 11/07/2022) 6 tablet 0  I have reviewed patient's Past Medical Hx, Surgical Hx, Family Hx, Social Hx, medications and allergies.   ROS:  Review of Systems  Constitutional:  Negative for chills and fever.  Gastrointestinal:  Positive for abdominal pain.  Genitourinary:  Positive for pelvic pain and vaginal discharge.   Other systems negative  Physical Exam  Patient Vitals for the past 24 hrs:  BP Temp Pulse Resp SpO2 Height Weight  03/18/23 0101 139/87 -- 61 -- 94 % -- --  03/18/23 0032 131/81 -- -- -- -- -- --  03/18/23 0030 -- 98.2 F (36.8 C) 71 18 99 % 5\' 10"  (1.778 m) 101.6 kg   Constitutional: Well-developed, well-nourished female in no acute distress.  Cardiovascular: normal rate and rhythm Respiratory: normal effort, clear  to auscultation bilaterally GI: Abd soft, non-tender, gravid appropriate for gestational age.   No rebound or guarding. MS: Extremities nontender, no edema, normal ROM Neurologic: Alert and oriented x 4.  GU: Neg CVAT.  PELVIC EXAM: Small amount of thin discharge, vaginal walls and external genitalia normal + ferning   FHT:  Baseline 140 , moderate variability, accelerations present, no decelerations Contractions:  Irregular     Labs: No results found for this or any previous visit (from the past 24 hour(s)).    Imaging:  No results found.  MAU Course/MDM: I have reviewed the triage vital signs and the nursing notes.   Pertinent labs & imaging results that were available during my care of the patient were reviewed by me and considered in my medical decision making (see chart for details).      I have reviewed her medical records including past results, notes and treatments.  NST reviewed, reactive Treatments in MAU included EFM, SSE.    Assessment: Single IUP at [redacted]w[redacted]d PROM at term Decreased fetal movement, now moving Prodromal vs latent labor  Plan: Admit to Labor and Delivery [facility] team to follow   Wynelle Bourgeois CNM, MSN Certified Nurse-Midwife 03/18/2023 1:17 AM

## 2023-03-18 NOTE — Progress Notes (Signed)
Debra Payne is a 27 y.o. G1P0 at [redacted]w[redacted]d in labor, pushing.   Subjective:  Patient feels contractions, is comfortable with epidural.   Objective: BP 99/84   Pulse 72   Temp 98 F (36.7 C)   Resp 18   Ht 5\' 10"  (1.778 m)   Wt 101.6 kg   LMP 06/05/2022   SpO2 99%   BMI 32.14 kg/m  No intake/output data recorded. No intake/output data recorded.  FHT:  FHR: 150 bpm, variability: moderate,  accelerations:  Present,  decelerations:  Present late decels with pushing, with good recovery between pushes.  UC:   irregular, every 4 to 5 minutes SVE:   Dilation: 10 Effacement (%): 100 Station: Plus 1 Exam by:: [employer]: Lab Results  Component Value Date   WBC 14.5 (H) 03/18/2023   HGB 12.2 03/18/2023   HCT 36.5 03/18/2023   MCV 93.8 03/18/2023   PLT 136 (L) 03/18/2023    Assessment / Plan: 27 y.o. G1P0 at [redacted]w[redacted]d EGA in labor, second stage, pushing.   Labor:  Restart pitocin for labor augmentation as contractions are spaced out.  Preeclampsia:   None.  Fetal Wellbeing:  Category II. Pain Control:  Epidural. I/D:   GBS positive and s/p 2 ancef doses.  Anticipated MOD:  NSVD.  [name], MD 03/18/2023, 2:52 PM

## 2023-03-18 NOTE — Lactation Note (Addendum)
This note was copied from a baby's chart. Lactation Consultation Note  Patient Name: Debra Payne [id number] Date: 03/18/2023 Age:29 hours Reason for consult: Follow-up assessment;Mother's request;Primapara;Term Mom called for latching assistance. Mom has generalized edema. Everted nipples flatten when compressed. LC used t-cup hold to get baby to latch. Baby doing a lot of tongue thrusting at first then started holding onto nipple while feeding and pausing. When she started getting tired baby tongue thrusting again and not able to hold onto nipple.  Noted respiration increasing. Placed baby between mom's breast STS and covered baby. Baby sleeping with shallow little pants. Notified RN. Encouraged parents to rest while baby resting. When respirations calm, place baby in bassinet and sleep. Set clock for 3 hrs and attempt feeding unless baby wakes sooner. Mom encouraged to feed baby 8-12 times/24 hours and with feeding cues.  Mom will probably need assistance until edema goes down. Encouraged mom to call. Mom is to wear shells in am Pre-pump  to evert nipples Reverse pressure before latching. Latch in football hold for now until easier to latch.  Maternal Data Does the patient have breastfeeding experience prior to this delivery?: No  Feeding    LATCH Score Latch: Repeated attempts needed to sustain latch, nipple held in mouth throughout feeding, stimulation needed to elicit sucking reflex.  Audible Swallowing: Spontaneous and intermittent  Type of Nipple: Everted at rest and after stimulation  Comfort (Breast/Nipple): Filling, red/small blisters or bruises, mild/mod discomfort (edema/ bruising)  Hold (Positioning): Full assist, staff holds infant at breast  LATCH Score: 6   Lactation Tools Discussed/Used Tools: Shells;Pump;Flanges Flange Size: 18 Breast pump type: Double-Electric Breast Pump Pump Education: Setup, frequency, and cleaning;Milk Storage Reason for  Pumping: supplementation/ bruising to head Pumping frequency: q3hr  Interventions Interventions: Breast feeding basics reviewed;Assisted with latch;Skin to skin;Breast massage;Hand express;Reverse pressure;Breast compression;Adjust position;Support pillows;Position options;Expressed milk;DEBP;Education  Discharge    Consult Status Consult Status: Follow-up Date: 03/19/23 Follow-up type: In-patient    Charyl Dancer 03/18/2023, 11:45 PM

## 2023-03-18 NOTE — Lactation Note (Signed)
This note was copied from a baby's chart. Lactation Consultation Note  Patient Name: Debra Payne [id number] Date: 03/18/2023 Age:29 hours Reason for consult: Initial assessment;Primapara;Term Baby has no interest in BF at this time. Had increase temp and respirations at birth. Baby very sleepy at this time. Noted severe bruising to entire top of head. Discussed possibility of baby having jaundice and the need for supplementation if that occurs. Mom has some colostrum collected. Suggested mom put colostrum in a spoon and offer to baby to remind the baby she might be hungry. Newborn feeding habits, STS, I&O, pump, milk storage reviewed. Mom encouraged to feed baby 8-12 times/24 hours and with feeding cues.  Encouraged mom to call for Lactation when mom is ready to BF.   Maternal Data Does the patient have breastfeeding experience prior to this delivery?: No  Feeding    LATCH Score Latch: Repeated attempts needed to sustain latch, nipple held in mouth throughout feeding, stimulation needed to elicit sucking reflex.  Audible Swallowing: None  Type of Nipple: Everted at rest and after stimulation  Comfort (Breast/Nipple): Soft / non-tender  Hold (Positioning): Assistance needed to correctly position infant at breast and maintain latch.  LATCH Score: 6   Lactation Tools Discussed/Used Tools: Shells;Pump;Flanges Flange Size: 18 Breast pump type: Double-Electric Breast Pump Pump Education: Setup, frequency, and cleaning;Milk Storage Reason for Pumping: supplementation/ bruising to head Pumping frequency: q3hr  Interventions Interventions: Breast feeding basics reviewed;Shells;DEBP;Education;LC Services brochure  Discharge    Consult Status Consult Status: Follow-up Date: 03/19/23 Follow-up type: In-patient    Charyl Dancer 03/18/2023, 9:09 PM

## 2023-03-18 NOTE — Progress Notes (Signed)
Debra Payne is a 27 y.o. G1P0 at [redacted]w[redacted]d by admitted for spontaneous rupture of membranes.   Subjective:  Patient is comfortable with epidural.  Objective: BP (!) 132/107   Pulse 85   Temp 98 F (36.7 C)   Resp 18   Ht 5\' 10"  (1.778 m)   Wt 101.6 kg   LMP 06/05/2022   SpO2 100%   BMI 32.14 kg/m  No intake/output data recorded. No intake/output data recorded.  FHT:  FHR: 150 bpm, variability: moderate,  accelerations:  Present,  decelerations:  Present variable, late with pushing. UC:   irregular, every 4 to 5 minutes SVE:   Dilation: 10 Effacement (%): 100 Station: Plus 1 Exam by:: [employer]: Lab Results  Component Value Date   WBC 14.5 (H) 03/18/2023   HGB 12.2 03/18/2023   HCT 36.5 03/18/2023   MCV 93.8 03/18/2023   PLT 136 (L) 03/18/2023    Assessment / Plan:  27 y.o. G1P0 at [redacted]w[redacted]d EGA in labor, second stage, pushing.  Labor:  Start pitocin for labor augmentation as contractions are spaced out.  Preeclampsia:   None.  Fetal Wellbeing:  Category II Pain Control:  Epidural I/D:   GBS positive and on ancef.  Anticipated MOD:  NSVD  [name], MD 03/18/2023, 12:55 PM

## 2023-03-18 NOTE — Anesthesia Procedure Notes (Addendum)
Epidural Patient location during procedure: OB Start time: 03/18/2023 6:22 AM End time: 03/18/2023 6:30 AM  Staffing Anesthesiologist: Bethena Midget, MD  Preanesthetic Checklist Completed: patient identified, IV checked, site marked, risks and benefits discussed, surgical consent, monitors and equipment checked, pre-op evaluation and timeout performed  Epidural Patient position: sitting Prep: DuraPrep and site prepped and draped Patient monitoring: continuous pulse ox and blood pressure Approach: midline Location: L3-L4 Injection technique: LOR air  Needle:  Needle type: Tuohy  Needle gauge: 17 G Needle length: 9 cm and 9 Needle insertion depth: 5 cm cm Catheter type: closed end flexible Catheter size: 19 Gauge Catheter at skin depth: 10 cm Test dose: negative  Assessment Events: blood not aspirated, no cerebrospinal fluid, injection not painful, no injection resistance, no paresthesia and negative IV test

## 2023-03-18 NOTE — Anesthesia Preprocedure Evaluation (Addendum)
Anesthesia Evaluation  Patient identified by MRN, date of birth, ID band Patient awake    Reviewed: Allergy & Precautions, H&P , NPO status , Patient's Chart, lab work & pertinent test results, reviewed documented beta blocker date and time   Airway Mallampati: III  TM Distance: >3 FB Neck ROM: full    Dental no notable dental hx. (+) Teeth Intact, Dental Advisory Given   Pulmonary neg pulmonary ROS, asthma    Pulmonary exam normal breath sounds clear to auscultation       Cardiovascular negative cardio ROS Normal cardiovascular exam Rhythm:regular Rate:Normal     Neuro/Psych  PSYCHIATRIC DISORDERS Anxiety     negative neurological ROS  negative psych ROS   GI/Hepatic negative GI ROS, Neg liver ROS,,,  Endo/Other  negative endocrine ROS    Renal/GU negative Renal ROS  negative genitourinary   Musculoskeletal   Abdominal   Peds  Hematology negative hematology ROS (+)   Anesthesia Other Findings   Reproductive/Obstetrics (+) Pregnancy                             Anesthesia Physical Anesthesia Plan  ASA: 2  Anesthesia Plan: Epidural   Post-op Pain Management:    Induction:   PONV Risk Score and Plan:   Airway Management Planned:   Additional Equipment:   Intra-op Plan:   Post-operative Plan:   Informed Consent: I have reviewed the patients History and Physical, chart, labs and discussed the procedure including the risks, benefits and alternatives for the proposed anesthesia with the patient or authorized representative who has indicated his/her understanding and acceptance.     Dental Advisory Given  Plan Discussed with:   Anesthesia Plan Comments: (Labs checked- platelets confirmed with RN in room. Fetal heart tracing, per RN, reported to be stable enough for sitting procedure. Discussed epidural, and patient consents to the procedure:  included risk of possible  headache,backache, failed block, allergic reaction, and nerve injury. This patient was asked if she had any questions or concerns before the procedure started.)       Anesthesia Quick Evaluation

## 2023-03-18 NOTE — H&P (Addendum)
Debra Payne is a 27 y.o. female presenting for SROM at 0700 03-17-23 and contracting q2min.  OB History     Gravida  1   Para      Term      Preterm      AB      Living         SAB      IAB      Ectopic      Multiple      Live Births             Past Medical History:  Diagnosis Date   Anxiety    and depression   Asthma    GAD (generalized anxiety disorder)    Nystagmus    Psoriasis    RA (rheumatoid arthritis) (HCC)    Vitamin D deficiency    Past Surgical History:  Procedure Laterality Date   WISDOM TOOTH EXTRACTION     Family History: She was adopted. Family history is unknown by patient. Social History:  reports that she has never smoked. She has never used smokeless tobacco. She reports that she does not drink alcohol and does not use drugs.     Maternal Diabetes: No Genetic Screening: Normal Maternal Ultrasounds/Referrals: Normal Fetal Ultrasounds or other Referrals:  None Maternal Substance Abuse:  No Significant Maternal Medications:  Meds include: Other: humira, lexapro, buspirone, levocetiriazine, montelukast, vitamin D3 Significant Maternal Lab Results:  Group B Strep positive Number of Prenatal Visits:greater than 3 verified prenatal visits Maternal Vaccinations:TDap 10-05-2020 Other Comments:  None  Review of Systems History Dilation: 1 Effacement (%): 80 Station: -2 Exam by:: Felipa Furnace RN Blood pressure 139/87, pulse 61, temperature 98.2 F (36.8 C), resp. rate 18, height 5\' 10"  (1.778 m), weight 101.6 kg, last menstrual period 06/05/2022, SpO2 94%. Exam Physical Exam  Lungs unlabored CV RRR Abdomen gravid, NT Extremities no calf tenderness  FHT 145, + accels, no decels, min-mod variability Toco q86min Prenatal labs: ABO, Rh:  A+ Antibody: Negative (03/19 0000) Rubella: Immune (03/19 0000) RPR: Nonreactive (03/19 0000)  HBsAg: Negative (03/19 0000)  HIV: Non-reactive (03/19 0000)  GBS: Positive/-- (10/03  0000)   EFW 4lbs 13 oz 24% 01-30-23  Assessment/Plan: 27yo G1P0 at 40 6/7wks being admitted with SROM at 0700  03-17-23 contracting q79min in probable latent labor.  Will start cefazolin for GBS +. Plan expectant mgmt for now and may need to augment with pitocin.   Fetal status overall reassuring with primarily cat 1 tracing and episodes of cat 2 with min variability.  Bedside ultrasound ordered to confirm cephalic presentation.  Purcell Nails 03/18/2023, 2:16 AM

## 2023-03-18 NOTE — MAU Note (Signed)
.  Debra Payne is a 27 y.o. at [redacted]w[redacted]d here in MAU reporting contractions all day that have gotten closer and stronger. Also thinks she has been leaking fld since mid morning. Clear fld but sometimes has brown flecks of what she thinks is her mucous plug. States baby's movements have been alittle slower today  Onset of complaint: Tues am Pain score: 6 Vitals:   03/18/23 0030 03/18/23 0032  BP:  131/81  Pulse: 71   Resp: 18   Temp: 98.2 F (36.8 C)   SpO2: 99%      FHT:140 Lab orders placed from triage:  labor eval

## 2023-03-19 ENCOUNTER — Inpatient Hospital Stay (HOSPITAL_COMMUNITY)
Admission: RE | Admit: 2023-03-19 | Payer: BC Managed Care – PPO | Source: Home / Self Care | Admitting: Obstetrics and Gynecology

## 2023-03-19 ENCOUNTER — Inpatient Hospital Stay (HOSPITAL_COMMUNITY): Payer: BC Managed Care – PPO

## 2023-03-19 LAB — CBC
HCT: 31.7 % — ABNORMAL LOW (ref 36.0–46.0)
Hemoglobin: 10.4 g/dL — ABNORMAL LOW (ref 12.0–15.0)
MCH: 31 pg (ref 26.0–34.0)
MCHC: 32.8 g/dL (ref 30.0–36.0)
MCV: 94.6 fL (ref 80.0–100.0)
Platelets: 133 10*3/uL — ABNORMAL LOW (ref 150–400)
RBC: 3.35 MIL/uL — ABNORMAL LOW (ref 3.87–5.11)
RDW: 13.1 % (ref 11.5–15.5)
WBC: 14.2 10*3/uL — ABNORMAL HIGH (ref 4.0–10.5)
nRBC: 0 % (ref 0.0–0.2)

## 2023-03-19 NOTE — Social Work (Signed)
MOB was referred for history of depression/anxiety.  * Referral screened out by Clinical Social Worker because none of the following criteria appear to apply:  ~ History of anxiety/depression during this pregnancy, or of post-partum depression following prior delivery.  ~ Diagnosis of anxiety and/or depression within last 3 years OR * MOB's symptoms currently being treated with medication and/or therapy. Per chart review MOB has an active prescription and is taking Buspar and Lexapro.   Please contact the Clinical Social Worker if needs arise, pr by MOB request.   Wende Neighbors, LCSWA Clinical Social Worker [phone]

## 2023-03-19 NOTE — Progress Notes (Signed)
Post Partum Day VAVD 1  Subjective: no complaints, up ad lib, voiding, tolerating PO, and + flatus  Objective: Blood pressure 129/80, pulse 66, temperature 97.8 F (36.6 C), temperature source Oral, resp. rate 18, height 5\' 10"  (1.778 m), weight 101.6 kg, last menstrual period 06/05/2022, SpO2 97%, unknown if currently breastfeeding.  Physical Exam:  General: alert, cooperative, and no distress Lochia: appropriate Uterine Fundus: firm Incision: n/a DVT Evaluation: no calf tenderness, trace to 1+ edema  Recent Labs    03/18/23 0342 03/19/23 0523  HGB 12.2 10.4*  HCT 36.5 31.7*    Assessment/Plan: Plan for discharge tomorrow, Breastfeeding, and Lactation consult Cont routine PP care  LOS: 1 day   Purcell Nails, MD 03/19/2023, 3:37 PM

## 2023-03-19 NOTE — Anesthesia Postprocedure Evaluation (Signed)
Anesthesia Post Note  Patient: Debra Payne  Procedure(s) Performed: AN AD HOC LABOR EPIDURAL     Patient location during evaluation: Mother Baby Anesthesia Type: Epidural Level of consciousness: awake, oriented and awake and alert Pain management: pain level controlled Vital Signs Assessment: post-procedure vital signs reviewed and stable Respiratory status: spontaneous breathing, respiratory function stable and nonlabored ventilation Cardiovascular status: stable Postop Assessment: no headache, adequate PO intake, able to ambulate, patient able to bend at knees and no apparent nausea or vomiting Anesthetic complications: no   No notable events documented.  Last Vitals:  Vitals:   03/19/23 0310 03/19/23 0750  BP: 101/72 129/80  Pulse: 72 66  Resp: 18 18  Temp: 36.5 C 36.6 C  SpO2:      Last Pain:  Vitals:   03/19/23 0921  TempSrc:   PainSc: 0-No pain   Pain Goal: Patients Stated Pain Goal: 0 (03/18/23 0036)                 Stevens Magwood

## 2023-03-19 NOTE — Lactation Note (Signed)
This note was copied from a baby's chart. Lactation Consultation Note  Patient Name: Debra Payne [id number] Date: 03/19/2023 Age:29 hours Reason for consult: Follow-up assessment;Primapara;Term Mom stated baby will latch for a few minutes then stop. Mom is giving her colostrum that she can express and pump then giving formula. Encouraged mom to call in am to make OP LC appt. For next week.  Mom may pump and bottle feed if the baby continue not to latch well.  Mom is pumping and wearing her shells between feedings and pumping. Encouraged mom to call for latch assistance.  Maternal Data    Feeding Mother's Current Feeding Choice: Breast Milk and Formula Nipple Type: Slow - flow  LATCH Score                    Lactation Tools Discussed/Used    Interventions    Discharge    Consult Status Consult Status: Follow-up Date: 03/20/23 Follow-up type: In-patient    [name], Diamond Nickel 03/19/2023, 10:02 PM

## 2023-03-20 MED ORDER — ACETAMINOPHEN 325 MG PO TABS: 650.0000 mg | ORAL_TABLET | Freq: Four times a day (QID) | ORAL | Status: AC | PRN

## 2023-03-20 MED ORDER — IBUPROFEN 600 MG PO TABS: 600.0000 mg | ORAL_TABLET | Freq: Four times a day (QID) | ORAL | 0 refills | Status: AC

## 2023-03-20 NOTE — Discharge Summary (Signed)
Postpartum Discharge Summary  Date of Service updated 03/20/23    Patient Name: Debra Payne DOB: [birth year] MRN: [id number]  Date of admission: 03/17/2023 Delivery date:03/18/2023 Delivering provider: Hoover Browns Date of discharge: 03/20/2023  Admitting diagnosis: Delayed delivery after SROM (spontaneous rupture of membranes) [O42.90] Intrauterine pregnancy: [redacted]w[redacted]d     Secondary diagnosis:  Principal Problem:   Delayed delivery after SROM (spontaneous rupture of membranes) Active Problems:   Vacuum-assisted vaginal delivery   Third degree laceration of perineum during delivery, postpartum  Additional problems: none    Discharge diagnosis: Term Pregnancy Delivered                                              Post partum procedures: none Augmentation: N/A Complications: prolonged second stage, Category II fetal surveillance  Hospital course: Onset of Labor With Vaginal Delivery      27 y.o. yo G1P1001 at [redacted]w[redacted]d was admitted in Latent Labor on 03/17/2023. Labor course was complicated by Category II fetal surveillance r  Membrane Rupture Time/Date: 7:00 AM,03/17/2023  Delivery Method:Vaginal, Vacuum (Extractor) Operative Delivery:Device used:vacuum Indication: Fetal indications and Prolonged second stage Episiotomy: None Lacerations:  Perineal;3rd degree Patient had a postpartum course was uncomplicated  She is ambulating, tolerating a regular diet, passing flatus, and urinating well. Patient is discharged home in stable condition on 03/20/23.  Newborn Data: Birth date:03/18/2023 Birth time:3:45 PM Gender:Female Living status:Living Apgars:6 ,8  Weight:3780 g  Magnesium Sulfate received: No BMZ received: No Rhophylac:N/A MMR:N/A Transfusion:No Immunizations administered:  There is no immunization history on file for this patient.  Physical exam  Vitals:   03/19/23 1652 03/19/23 2036 03/20/23 0527 03/20/23 1353  BP: 125/81 111/88 111/84 124/84  Pulse: 86 78  70 67  Resp: 17 18 16 16   Temp: 98.9 F (37.2 C)  98 F (36.7 C) 98.9 F (37.2 C)  TempSrc: Oral  Oral Oral  SpO2: 100% 99% 99% 98%  Weight:      Height:       General: alert, cooperative, and no distress Lochia: appropriate Uterine Fundus: firm Perineum: well-approximated, healing DVT Evaluation: No evidence of DVT seen on physical exam. No cords or calf tenderness. No significant calf/ankle edema. Labs: Lab Results  Component Value Date   WBC 14.2 (H) 03/19/2023   HGB 10.4 (L) 03/19/2023   HCT 31.7 (L) 03/19/2023   MCV 94.6 03/19/2023   PLT 133 (L) 03/19/2023       No data to display         Edinburgh Score:    03/19/2023   12:20 PM  Edinburgh Postnatal Depression Scale Screening Tool  I have been able to laugh and see the funny side of things. 0  I have looked forward with enjoyment to things. 0  I have blamed myself unnecessarily when things went wrong. 1  I have been anxious or worried for no good reason. 2  I have felt scared or panicky for no good reason. 1  Things have been getting on top of me. 1  I have been so unhappy that I have had difficulty sleeping. 0  I have felt sad or miserable. 1  I have been so unhappy that I have been crying. 0  The thought of harming myself has occurred to me. 0  Edinburgh Postnatal Depression Scale Total 6      After  visit meds:  Allergies as of 03/20/2023       Reactions   Amoxil [amoxicillin] Hives   Latex Itching, Rash        Medication List     STOP taking these medications    nitrofurantoin (macrocrystal-monohydrate) 100 MG capsule Commonly known as: MACROBID   phenazopyridine 200 MG tablet Commonly known as: PYRIDIUM       TAKE these medications    acetaminophen 325 MG tablet Commonly known as: Tylenol Take 2 tablets (650 mg total) by mouth every 6 (six) hours as needed (for pain scale < 4).   busPIRone 30 MG tablet Commonly known as: BUSPAR Take 30 mg by mouth 2 (two) times daily. 20mg   QAM 10mg  QHS   escitalopram 10 MG tablet Commonly known as: LEXAPRO Take 10 mg by mouth daily.   Humira (2 Syringe) 40 MG/0.4ML prefilled syringe Generic drug: adalimumab Inject into the skin.   ibuprofen 600 MG tablet Commonly known as: ADVIL Take 1 tablet (600 mg total) by mouth every 6 (six) hours.   levocetirizine 5 MG tablet Commonly known as: XYZAL Take 5 mg by mouth every evening.   prenatal multivitamin Tabs tablet Take 1 tablet by mouth daily at 12 noon.   Singulair 10 MG tablet Generic drug: montelukast Take 10 mg by mouth at bedtime.         Discharge home in stable condition Infant Feeding: Bottle and Breast Infant Disposition:home with mother Discharge instruction: per After Visit Summary and Postpartum booklet. Activity: Advance as tolerated. Pelvic rest for 6 weeks.  Diet: routine diet Anticipated Birth Control: Condoms Postpartum Appointment:6 weeks Additional Postpartum F/U:  none Future Appointments:No future appointments. Follow up Visit:  Follow-up Information     [facility]. Go in 6 week(s).   Specialty: Obstetrics and Gynecology Contact information: [address] [place] [address] [phone]                    03/20/2023 Roma Schanz, CNM

## 2023-03-20 NOTE — Lactation Note (Signed)
This note was copied from a baby's chart. Lactation Consultation Note  Patient Name: Girl Nola Botkins [id number] Date: 03/20/2023 Age:29 hours Reason for consult: Maternal discharge;Follow-up assessment;Primapara;Term;1st time breastfeeding  Visited P1 parent of term infant for discharge consult. Parent mentioned she is formula feeding and supplementing with her frozen colostrum until her milk comes in. [facility] emphasized importance of pumping during bottle feeds to establish milk supply if breastfeeding is the ultimate goal. Parent verbalized understanding and mentioned this would be easier at home. LC reviewed discharge education and outpatient services.  Feeding Mother's Current Feeding Choice: Breast Milk and Formula Nipple Type: Slow - flow  Interventions Interventions: Breast feeding basics reviewed;Pace feeding;LC Services brochure  Discharge Discharge Education: Engorgement and breast care;Warning signs for feeding baby;Outpatient recommendation  Consult Status Consult Status: Complete Date: 03/20/23    Antionette Char 03/20/2023, 2:09 PM

## 2023-04-06 ENCOUNTER — Other Ambulatory Visit: Payer: Self-pay

## 2023-04-06 ENCOUNTER — Encounter (HOSPITAL_BASED_OUTPATIENT_CLINIC_OR_DEPARTMENT_OTHER): Payer: Self-pay | Admitting: Emergency Medicine

## 2023-04-06 ENCOUNTER — Emergency Department (HOSPITAL_BASED_OUTPATIENT_CLINIC_OR_DEPARTMENT_OTHER)
Admission: EM | Admit: 2023-04-06 | Discharge: 2023-04-06 | Disposition: A | Discharge status: Home / Self Care | Payer: BC Managed Care – PPO | Attending: Emergency Medicine | Admitting: Emergency Medicine

## 2023-04-06 ENCOUNTER — Emergency Department (HOSPITAL_BASED_OUTPATIENT_CLINIC_OR_DEPARTMENT_OTHER): Payer: BC Managed Care – PPO

## 2023-04-06 DIAGNOSIS — Z91040 Latex allergy status: Secondary | ICD-10-CM | POA: Diagnosis not present

## 2023-04-06 DIAGNOSIS — Z1152 Encounter for screening for COVID-19: Secondary | ICD-10-CM | POA: Insufficient documentation

## 2023-04-06 DIAGNOSIS — R509 Fever, unspecified: Secondary | ICD-10-CM | POA: Diagnosis present

## 2023-04-06 DIAGNOSIS — R000 Tachycardia, unspecified: Secondary | ICD-10-CM | POA: Insufficient documentation

## 2023-04-06 LAB — CBC
HCT: 41.4 % (ref 36.0–46.0)
Hemoglobin: 13.6 g/dL (ref 12.0–15.0)
MCH: 31.3 pg (ref 26.0–34.0)
MCHC: 32.9 g/dL (ref 30.0–36.0)
MCV: 95.4 fL (ref 80.0–100.0)
Platelets: 249 10*3/uL (ref 150–400)
RBC: 4.34 MIL/uL (ref 3.87–5.11)
RDW: 12.2 % (ref 11.5–15.5)
WBC: 11.9 10*3/uL — ABNORMAL HIGH (ref 4.0–10.5)
nRBC: 0 % (ref 0.0–0.2)

## 2023-04-06 LAB — BASIC METABOLIC PANEL
Anion gap: 8 (ref 5–15)
BUN: 7 mg/dL (ref 6–20)
CO2: 26 mmol/L (ref 22–32)
Calcium: 9.2 mg/dL (ref 8.9–10.3)
Chloride: 101 mmol/L (ref 98–111)
Creatinine, Ser: 0.83 mg/dL (ref 0.44–1.00)
GFR, Estimated: >60 mL/min (ref >60)
Glucose, Bld: 143 mg/dL — ABNORMAL HIGH (ref 70–99)
Potassium: 3.8 mmol/L (ref 3.5–5.1)
Sodium: 135 mmol/L (ref 135–145)

## 2023-04-06 LAB — URINALYSIS, ROUTINE W REFLEX MICROSCOPIC
Bacteria, UA: NONE SEEN
Bilirubin Urine: NEGATIVE
Glucose, UA: NEGATIVE mg/dL
Ketones, ur: NEGATIVE mg/dL
Nitrite: NEGATIVE
Protein, ur: 30 mg/dL — AB
Specific Gravity, Urine: 1.021 (ref 1.005–1.030)
pH: 8 (ref 5.0–8.0)

## 2023-04-06 LAB — RESP PANEL BY RT-PCR (RSV, FLU A&B, COVID)  RVPGX2
Influenza A by PCR: NEGATIVE
Influenza B by PCR: NEGATIVE
Resp Syncytial Virus by PCR: NEGATIVE
SARS Coronavirus 2 by RT PCR: NEGATIVE

## 2023-04-06 LAB — PREGNANCY, URINE: Preg Test, Ur: NEGATIVE

## 2023-04-06 LAB — LACTIC ACID, PLASMA: Lactic Acid, Venous: 1.5 mmol/L (ref 0.5–1.9)

## 2023-04-06 LAB — CBG MONITORING, ED: Glucose-Capillary: 135 mg/dL — ABNORMAL HIGH (ref 70–99)

## 2023-04-06 NOTE — ED Provider Notes (Signed)
[place] EMERGENCY DEPARTMENT AT [facility] Provider Note   CSN: [id number] Arrival date & time: 04/06/23  1326     History  Chief Complaint  Patient presents with   Loss of Consciousness    Debra Payne is a 27 y.o. female.  This is a 27 year old female, otherwise healthy, here today for a syncopal episode and fever.  Fever began on Thursday, has been intermittent in nature, as high as 103.  Patient is 3 weeks postpartum, had vaginal delivery assisted by vacuum suction.  Patient did suffer a third-degree perineum tear.  She has not had any cough, congestion, sore throat, abdominal pain.  She has not had any dysuria, abnormal vaginal discharge.  She has not had any perineal pain.  She says that she thought she was developing some mastitis a couple of days ago, however that is since improved.   Loss of Consciousness      Home Medications Prior to Admission medications   Medication Sig Start Date End Date Taking? Authorizing Provider  acetaminophen (TYLENOL) 325 MG tablet Take 2 tablets (650 mg total) by mouth every 6 (six) hours as needed (for pain scale < 4). 03/20/23   Roma Schanz, CNM  Adalimumab (HUMIRA, 2 SYRINGE,) 40 MG/0.4ML PSKT Inject into the skin.    [provider]  busPIRone (BUSPAR) 30 MG tablet Take 30 mg by mouth 2 (two) times daily. 20mg  QAM 10mg  QHS    [provider]  escitalopram (LEXAPRO) 10 MG tablet Take 10 mg by mouth daily.    [provider]  ibuprofen (ADVIL) 600 MG tablet Take 1 tablet (600 mg total) by mouth every 6 (six) hours. 03/20/23   Roma Schanz, CNM  levocetirizine (XYZAL) 5 MG tablet Take 5 mg by mouth every evening.    [provider]  montelukast (SINGULAIR) 10 MG tablet Take 10 mg by mouth at bedtime.    [provider]  Prenatal Vit-Fe Fumarate-FA (PRENATAL MULTIVITAMIN) TABS tablet Take 1 tablet by mouth daily at 12 noon.    [provider]       Allergies    Amoxil [amoxicillin] and Latex    Review of Systems   Review of Systems  Cardiovascular:  Positive for syncope.    Physical Exam Updated Vital Signs BP 118/75   Pulse (!) 106   Temp 99.7 F (37.6 C)   Resp 11   LMP 06/05/2022   SpO2 97%   Breastfeeding Yes  Physical Exam Vitals reviewed.  HENT:     Head: Normocephalic and atraumatic.  Cardiovascular:     Rate and Rhythm: Tachycardia present.  Pulmonary:     Effort: Pulmonary effort is normal.     Breath sounds: Normal breath sounds.  Abdominal:     General: Abdomen is flat. There is no distension.     Palpations: Abdomen is soft.     Tenderness: There is no abdominal tenderness. There is no guarding.  Musculoskeletal:        General: No swelling. Normal range of motion.  Neurological:     Mental Status: She is alert.     ED Results / Procedures / Treatments   Labs (all labs ordered are listed, but only abnormal results are displayed) Labs Reviewed  BASIC METABOLIC PANEL - Abnormal; Notable for the following components:      Result Value   Glucose, Bld 143 (*)    All other components within normal limits  CBC - Abnormal; Notable for the  following components:   WBC 11.9 (*)    All other components within normal limits  URINALYSIS, ROUTINE W REFLEX MICROSCOPIC - Abnormal; Notable for the following components:   Hgb urine dipstick TRACE (*)    Protein, ur 30 (*)    Leukocytes,Ua SMALL (*)    All other components within normal limits  CBG MONITORING, ED - Abnormal; Notable for the following components:   Glucose-Capillary 135 (*)    All other components within normal limits  RESP PANEL BY RT-PCR (RSV, FLU A&B, COVID)  RVPGX2  CULTURE, BLOOD (ROUTINE X 2)  CULTURE, BLOOD (ROUTINE X 2)  PREGNANCY, URINE  LACTIC ACID, PLASMA    EKG None  Radiology No results found.  Procedures Procedures    Medications Ordered in ED Medications - No data to display  ED Course/ Medical Decision  Making/ A&P                                 Medical Decision Making 27 year old female here today for fever of unknown origin.  Differential diagnoses include viral syndrome, gynecological infection, cellulitis.  Plan-no clear source on the patient's fever.  Patient had labs drawn at triage.  Have added on viral panel, chest x-ray.  She does not have a leukocytosis, no infection in her urine.  On arrival, patient was tachycardic to the 130s.  Heart rate came down without any intervention here in the emergency department.  Will write her some IV fluid, Tylenol.  If patient's viral swab and chest x-ray are unrevealing, we will perform an external GU exam.  Patient outside of the window of retained products of conception.  She has no abdominal tenderness, feels as though her perineal pain is improving.  Embolism less likely.  Patient had a rather high fever which is not consistent with PE.  Patient without any respiratory symptoms, has not felt short of breath.  Reassessment 5 PM-my dependent review the patient's chest x-ray shows no pneumonia.  Patient viral panel negative.  I did perform an x-ray GU exam on the patient, no redness, no swelling.  Unlikely to be source of infection.  Breast exam similarly normal.  I do not have a source of infection on this patient, based on her blood work, believe this is likely viral.  Counseled the patient on typical time course of symptoms, advised her to return to the emergency department or to her PCP if symptoms worsened.  Patient was agreeable with this plan.  Overall, at time of discharge patient looks well, is ambulatory without any difficulty.  Amount and/or Complexity of Data Reviewed Labs: ordered. Radiology: ordered.           Final Clinical Impression(s) / ED Diagnoses Final diagnoses:  Fever, unspecified fever cause    Rx / DC Orders ED Discharge Orders     None         Arletha Pili, DO 04/06/23 1716

## 2023-04-06 NOTE — ED Triage Notes (Signed)
Pt reports  3 wks postpartum, vaginal delivery, endorses fever x 3 days, syncope today. AOx4. Pt reports fall from standing

## 2023-04-06 NOTE — Discharge Instructions (Addendum)
While you are in the emergency department, you have blood work done that was normal.  Look for signs of infection in your urine, with a chest x-ray and everything checked out okay.  He will be contacted if your blood cultures or urine cultures are positive.  I do think that this is likely being caused by a virus.  You can take Tylenol for the next few days.  Your symptoms should begin to get better over the next couple of days.  Come back to the emergency department if you feel as though you are getting worse, or if your symptoms do not improve in a few days.

## 2023-04-06 NOTE — ED Notes (Signed)
Blood cultures and LA collected with blood work

## 2023-04-06 NOTE — ED Notes (Signed)
Discharge instructions reviewed with patient. Patient questions answered and opportunity for education reviewed. Patient voices understanding of discharge instructions with no further questions. Patient ambulatory with steady gait to lobby.  

## 2023-04-06 NOTE — ED Notes (Signed)
Pt given gatorlyte and instructed to drink over 60 minutes.

## 2023-04-11 ENCOUNTER — Telehealth (HOSPITAL_COMMUNITY): Payer: Self-pay | Admitting: *Deleted

## 2023-04-11 LAB — CULTURE, BLOOD (ROUTINE X 2)
Culture: NO GROWTH
Culture: NO GROWTH
Special Requests: ADEQUATE
Special Requests: ADEQUATE

## 2023-04-11 NOTE — Telephone Encounter (Signed)
04/11/2023  Name: Debra Payne MRN: [id number] DOB: 1995-11-13  Reason for Call:  Transition of Care Hospital Discharge Call  Contact Status: Patient Contact Status: Complete  Language assistant needed:          Follow-Up Questions: Do You Have Any Concerns About Your Health As You Heal From Delivery?: No Do You Have Any Concerns About Your Infants Health?: No  Edinburgh Postnatal Depression Scale:  In the Past 7 Days:   EPDS not done at this time. Patient stated, "I did that at the pediatrician's office, about a week and a half ago." Unable to recall her score, but patient reported no areas of concern identified. PHQ2-9 Depression Scale:     Discharge Follow-up: Edinburgh score requires follow up?: N/A Patient was advised of the following resources:: Breastfeeding Support Group, Support Group  Post-discharge interventions: Reviewed Newborn Safe Sleep Practices  Signature Deforest Hoyles, RN, 04/11/23, [phone]

## 2023-05-16 ENCOUNTER — Inpatient Hospital Stay (HOSPITAL_COMMUNITY)
Admission: AD | Admit: 2023-05-16 | Discharge: 2023-05-16 | Disposition: A | Discharge status: Home / Self Care | Payer: BC Managed Care – PPO | Attending: Obstetrics & Gynecology | Admitting: Obstetrics & Gynecology

## 2023-05-16 ENCOUNTER — Encounter (HOSPITAL_COMMUNITY): Payer: Self-pay | Admitting: Obstetrics & Gynecology

## 2023-05-16 DIAGNOSIS — R102 Pelvic and perineal pain: Secondary | ICD-10-CM | POA: Insufficient documentation

## 2023-05-16 DIAGNOSIS — F411 Generalized anxiety disorder: Secondary | ICD-10-CM | POA: Insufficient documentation

## 2023-05-16 DIAGNOSIS — T192XXA Foreign body in vulva and vagina, initial encounter: Secondary | ICD-10-CM | POA: Insufficient documentation

## 2023-05-16 DIAGNOSIS — W449XXA Unspecified foreign body entering into or through a natural orifice, initial encounter: Secondary | ICD-10-CM | POA: Diagnosis not present

## 2023-05-16 DIAGNOSIS — M069 Rheumatoid arthritis, unspecified: Secondary | ICD-10-CM | POA: Diagnosis not present

## 2023-05-16 DIAGNOSIS — F32A Depression, unspecified: Secondary | ICD-10-CM | POA: Insufficient documentation

## 2023-05-16 DIAGNOSIS — O99345 Other mental disorders complicating the puerperium: Secondary | ICD-10-CM | POA: Diagnosis not present

## 2023-05-16 NOTE — MAU Provider Note (Signed)
MAU Provider Note  Chief Complaint: Vaginal Pain   Event Date/Time   First Provider Initiated Contact with Patient 05/16/23 1234      SUBJECTIVE HPI: Debra Payne is a 27 y.o. G1P1001 8 weeks postpartum who presents to maternity admissions reporting complication from perineal revision yesterday. Pregnancy c/b 3a laceration after vacuum delivery. Receives [facility] with [facility].  Patient notes have significant scar tissue after 3a tear at 6 week PP visit. She underwent a revision yesterday under anesthesia in office yesterday. Had bleeding about the amount of a period and felt pelvic pressure "like a tampon isn't in the right spot". Looked with a mirror and saw a brown bulge, so came in for evaluation. She denies fever/chills, no pain, no active bleeding at this time.   HPI  Past Medical History:  Diagnosis Date   Anxiety    and depression   Asthma    GAD (generalized anxiety disorder)    Nystagmus    Psoriasis    RA (rheumatoid arthritis) (HCC)    Vitamin D deficiency    Past Surgical History:  Procedure Laterality Date   REVISION OF SCAR TISSUE RECTUS MUSCLE     WISDOM TOOTH EXTRACTION     Social History   Socioeconomic History   Marital status: Married    Spouse name: Not on file   Number of children: Not on file   Years of education: Not on file   Highest education level: Not on file  Occupational History   Not on file  Tobacco Use   Smoking status: Never   Smokeless tobacco: Never  Vaping Use   Vaping status: Never Used  Substance and Sexual Activity   Alcohol use: Never   Drug use: Never   Sexual activity: Not Currently  Other Topics Concern   Not on file  Social History Narrative   Not on file   Social Determinants of Health   Financial Resource Strain: Not on file  Food Insecurity: No Food Insecurity (03/18/2023)   Hunger Vital Sign    Worried About Running Out of Food in the Last Year: Never true    Ran Out of Food in the Last Year: Never true   Transportation Needs: No Transportation Needs (03/18/2023)   PRAPARE - Administrator, Civil Service (Medical): No    Lack of Transportation (Non-Medical): No  Physical Activity: Not on file  Stress: Not on file  Social Connections: Not on file  Intimate Partner Violence: Not At Risk (03/18/2023)   Humiliation, Afraid, Rape, and Kick questionnaire    Fear of Current or Ex-Partner: No    Emotionally Abused: No    Physically Abused: No    Sexually Abused: No   No current facility-administered medications on file prior to encounter.   Current Outpatient Medications on File Prior to Encounter  Medication Sig Dispense Refill   Adalimumab (HUMIRA, 2 SYRINGE,) 40 MG/0.4ML PSKT Inject into the skin.     busPIRone (BUSPAR) 30 MG tablet Take 30 mg by mouth 2 (two) times daily. 20mg  QAM 10mg  QHS     escitalopram (LEXAPRO) 10 MG tablet Take 10 mg by mouth daily.     levocetirizine (XYZAL) 5 MG tablet Take 5 mg by mouth every evening.     montelukast (SINGULAIR) 10 MG tablet Take 10 mg by mouth at bedtime.     acetaminophen (TYLENOL) 325 MG tablet Take 2 tablets (650 mg total) by mouth every 6 (six) hours as needed (for pain scale <  4).     ibuprofen (ADVIL) 600 MG tablet Take 1 tablet (600 mg total) by mouth every 6 (six) hours. 30 tablet 0   Prenatal Vit-Fe Fumarate-FA (PRENATAL MULTIVITAMIN) TABS tablet Take 1 tablet by mouth daily at 12 noon.     Allergies  Allergen Reactions   Amoxil [Amoxicillin] Hives   Latex Itching and Rash    ROS:  Pertinent positives/negatives listed above.  I have reviewed patient's Past Medical Hx, Surgical Hx, Family Hx, Social Hx, medications and allergies.   Physical Exam  Patient Vitals for the past 24 hrs:  BP Temp Temp src Pulse Resp SpO2  05/16/23 1225 115/74 -- -- 85 -- --  05/16/23 1208 114/77 98.6 F (37 C) Oral (!) 102 14 98 %   Constitutional: Well-developed, well-nourished female in no acute distress  Cardiovascular: normal  rate Respiratory: normal effort GI: Abd soft, non-tender MS: Extremities nontender, no edema, normal ROM Neurologic: Alert and oriented x 4  Pelvic: Brown mass present at introitus after retraction of labia. This was removed with no difficulty; found to be a sponge/lap. Otherwise perineal skin, rectum, vaginal tissue all closed and well approximated on external and internal exam. Dried scant blood in vaginal vault.  LAB RESULTS No results found for this or any previous visit (from the past 24 hour(s)).  --/--/A POS (10/09 0339)  IMAGING No results found.  MAU Management/MDM: Orders Placed This Encounter  Procedures   Discharge patient    No orders of the defined types were placed in this encounter.    Available prenatal/delivery records.  Patient presents after perineal scar revision yesterday after 3a tear with delivery. Exam showed retained lap. After this was removed, patient's discomfort resolved. No active bleeding and laceration all well approximated and appropriately healing for 24 hours after procedure. No signs of infection at this time. Discharged home with outpatient follow-up and close return precautions.  ASSESSMENT 1. Retained vaginal foreign body, initial encounter   2. Third degree laceration of perineum during delivery, postpartum     PLAN Discharge home with strict return precautions. Allergies as of 05/16/2023       Reactions   Amoxil [amoxicillin] Hives   Latex Itching, Rash        Medication List     TAKE these medications    acetaminophen 325 MG tablet Commonly known as: Tylenol Take 2 tablets (650 mg total) by mouth every 6 (six) hours as needed (for pain scale < 4).   busPIRone 30 MG tablet Commonly known as: BUSPAR Take 30 mg by mouth 2 (two) times daily. 20mg  QAM 10mg  QHS   escitalopram 10 MG tablet Commonly known as: LEXAPRO Take 10 mg by mouth daily.   Humira (2 Syringe) 40 MG/0.4ML prefilled syringe Generic drug:  adalimumab Inject into the skin.   ibuprofen 600 MG tablet Commonly known as: ADVIL Take 1 tablet (600 mg total) by mouth every 6 (six) hours.   levocetirizine 5 MG tablet Commonly known as: XYZAL Take 5 mg by mouth every evening.   prenatal multivitamin Tabs tablet Take 1 tablet by mouth daily at 12 noon.   Singulair 10 MG tablet Generic drug: montelukast Take 10 mg by mouth at bedtime.         Wylene Simmer, MD OB Fellow 05/16/2023  1:32 PM

## 2023-05-16 NOTE — MAU Note (Signed)
..  Debra Payne is a 27 y.o. at here in MAU reporting: delivered vaginally in October and had a 3rd degree tear. States she had her 6 week follow up x2 weeks ago and they stated she needed another vaginal repair to remove scar tissue and that was completed yesterday under anesthesia in the office(12/6). This morning she felt a lot of vaginal pressure and when she looked down there she states she can see something brown that looks solid and it does not look like swollen tissue. She last had tylenol last night.   Pain score: 3 Vitals:   05/16/23 1208  BP: 114/77  Pulse: (!) 102  Resp: 14  Temp: 98.6 F (37 C)  SpO2: 98%
